# Patient Record
Sex: Female | Born: 1980 | Race: White | Hispanic: No | Marital: Single | Smoking: Former smoker
Health system: Southern US, Community
[De-identification: ages and names within clinical notes are randomized; demographics above are authoritative.]

## PROBLEM LIST (undated history)

## (undated) DIAGNOSIS — T8859XA Other complications of anesthesia, initial encounter: Secondary | ICD-10-CM

## (undated) DIAGNOSIS — B019 Varicella without complication: Secondary | ICD-10-CM

## (undated) DIAGNOSIS — F32A Depression, unspecified: Secondary | ICD-10-CM

## (undated) DIAGNOSIS — M542 Cervicalgia: Secondary | ICD-10-CM

## (undated) DIAGNOSIS — K219 Gastro-esophageal reflux disease without esophagitis: Secondary | ICD-10-CM

## (undated) DIAGNOSIS — Z8489 Family history of other specified conditions: Secondary | ICD-10-CM

## (undated) DIAGNOSIS — M1990 Unspecified osteoarthritis, unspecified site: Secondary | ICD-10-CM

## (undated) DIAGNOSIS — Z9889 Other specified postprocedural states: Secondary | ICD-10-CM

## (undated) DIAGNOSIS — R112 Nausea with vomiting, unspecified: Secondary | ICD-10-CM

## (undated) DIAGNOSIS — J45909 Unspecified asthma, uncomplicated: Secondary | ICD-10-CM

## (undated) DIAGNOSIS — F329 Major depressive disorder, single episode, unspecified: Secondary | ICD-10-CM

## (undated) DIAGNOSIS — G5601 Carpal tunnel syndrome, right upper limb: Secondary | ICD-10-CM

## (undated) HISTORY — PX: OTHER SURGICAL HISTORY: SHX169

## (undated) HISTORY — DX: Major depressive disorder, single episode, unspecified: F32.9

## (undated) HISTORY — DX: Depression, unspecified: F32.A

## (undated) HISTORY — PX: FOOT SURGERY: SHX648

## (undated) HISTORY — PX: TONSILLECTOMY: SUR1361

## (undated) HISTORY — PX: HAND SURGERY: SHX662

## (undated) HISTORY — PX: SHOULDER SURGERY: SHX246

## (undated) HISTORY — DX: Varicella without complication: B01.9

---

## 2010-01-09 HISTORY — PX: TONSILLECTOMY: SUR1361

## 2014-06-15 ENCOUNTER — Ambulatory Visit: Payer: No Typology Code available for payment source | Attending: Orthopedic Surgery | Admitting: Physical Therapy

## 2014-06-15 DIAGNOSIS — M7711 Lateral epicondylitis, right elbow: Secondary | ICD-10-CM | POA: Insufficient documentation

## 2014-06-24 ENCOUNTER — Ambulatory Visit: Payer: No Typology Code available for payment source | Admitting: Occupational Therapy

## 2014-06-24 DIAGNOSIS — M25421 Effusion, right elbow: Secondary | ICD-10-CM

## 2014-06-24 DIAGNOSIS — M25521 Pain in right elbow: Principal | ICD-10-CM

## 2014-06-24 DIAGNOSIS — M7711 Lateral epicondylitis, right elbow: Secondary | ICD-10-CM | POA: Diagnosis present

## 2014-06-24 DIAGNOSIS — M6281 Muscle weakness (generalized): Secondary | ICD-10-CM

## 2014-06-24 DIAGNOSIS — M25621 Stiffness of right elbow, not elsewhere classified: Secondary | ICD-10-CM

## 2014-06-24 DIAGNOSIS — M25631 Stiffness of right wrist, not elsewhere classified: Secondary | ICD-10-CM

## 2014-06-25 NOTE — Patient Instructions (Signed)
Pt ed on contrast for R elbow and no stretches or Nerve glides yet Wrist splint all the time 'and put on her lateral epicondylitis band  To get elbow pad to take pressure during day off cubital tunnel and then at night time inside to decrease flexion   Cross friction massage to lat epicondyle after contrast  Can do ice massage   Ed on modifying picking up objects- using palm, forearm - NOT palm down  Not tight grip or twisting - bigger and fatter handles

## 2014-06-25 NOTE — Therapy (Signed)
[facility] PH[facility]ORTS MEDICINE [address], [place], [address] Phone: [phone]   Fax:  [phone]  Occupational Therapy Treatment  Patient Details  Name: Stephanie Lyons MRN: [id number] Date of Birth: [birth year] Referring Provider:  Dedra Skeens, PA-C  Encounter Date: 06/24/2014      OT End of Session - 06/25/14 0837    Visit Number 1   Number of Visits 6   Date for OT Re-Evaluation 07/15/14   OT Start Time 1245      No past medical history on file.  No past surgical history on file.  There were no vitals filed for this visit.  Visit Diagnosis:  Pain and swelling of elbow, right - Plan: OT PLAN OF CARE CERT/RE-CERT  Stiffness of elbow joint, right - Plan: OT PLAN OF CARE CERT/RE-CERT  Stiffness of wrist joint, right - Plan: OT PLAN OF CARE CERT/RE-CERT  Muscle weakness - Plan: OT PLAN OF CARE CERT/RE-CERT      Subjective Assessment - 06/24/14 1823    Subjective  I had this for about 3 yrs - gradually got worse and I did not had insurance - the pain is between 6-10 constant - cannot use my R hand and arm in many actiivities   Patient Stated Goals Want the pain and numbness better that I can use my arm again    Currently in Pain? Yes   Pain Score 10-Worst pain ever   Pain Location Arm   Pain Orientation Left   Pain Descriptors / Indicators Aching;Burning;Pins and needles   Pain Type Chronic pain   Pain Onset Other (comment)   Pain Frequency Constant                              OT Education - 06/25/14 0837    Education provided Yes   Education Details pt instruction see   Person(s) Educated Patient   Methods Explanation;Demonstration;Verbal cues;Handout   Comprehension Verbalized understanding;Returned demonstration;Verbal cues required          OT Short Term Goals - 06/25/14 0924    OT SHORT TERM GOAL #1   Title Pain on PREE improve by 10-15 points   Baseline Pain on PREE  47/50    Time 3   Period Weeks   Status New   OT SHORT TERM GOAL #2   Title Pt to be Ind in wearing wrist splint, lateral epicondyle strap and elbow guard to decrease symptoms    Baseline Pain 10/10 at elbow at the worse and 100% of time - and numbness on ulnar digits    Time 2   Period Weeks   Status New   OT SHORT TERM GOAL #3   Title Wrist and elbow AROM improve in  flexion and extention by 10-15 degrees with pain 3 down on scale   Baseline 3   Period Weeks   Status New           OT Long Term Goals - 06/25/14 2841    OT LONG TERM GOAL #1   Title Pt to be Ind in HEP to decrease pain and function on PREE by 15 points    Baseline PREE 47/50; function 46/50   Time 3   Period Weeks   Status New               Plan - 06/24/14 1444    Clinical Impression Statement Pt had symptoms for  about 2-3 yrs for lateral epicondylitis but numbness started about 6 months ago - present with increase pain - tenderness over extensor mass of forearm and lateral epicondyle - positive decrease grip and middle finger  extention test  positivie -  also signs for Cubital tunnel - posiitvie Tinel  and numnbess ulnar 3 digits -  pt ed on wearing strap at lateral epicondyle and fitted with wrist  splint to rest extentsors n- also ed on modificationa and getting  elbow guard for  sleeping and during day wear  to decrease flexion and pressure on cubital tunnel -  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 - will try 4-5 sessions of ionto , with splint wearing and HEP - pt follow up with MD 6/30    Pt will benefit from skilled therapeutic intervention in order to improve on the following deficits (Retired) Decreased range of motion;Impaired flexibility;Pain;Impaired UE functional use;Decreased knowledge of use of DME;Decreased strength   Rehab Potential Fair   Clinical Impairments Affecting Rehab Potential Had lateral epicondylits for about 2-3 yrs and had in past 3 shots    OT Frequency 2x / week   OT Duration  Other (comment)   OT Treatment/Interventions Self-care/ADL training;Therapeutic exercise;Patient/family education;Splinting;Manual Therapy;Ultrasound;Iontophoresis;DME and/or AE instruction;Moist Heat;Contrast Bath;Passive range of motion   Plan Assess use with wrist splint , elbow guard and lateral epicondylitis strap - as well as HEP    OT Home Exercise Plan see pt instruction   Consulted and Agree with Plan of Care Patient        Problem List There are no active problems to display for this patient.   Oletta Cohn OTR/L, CLT 06/25/2014, 9:34 AM  [place] [facility] PHYSICAL AND SPORTS MEDICINE [address] [place], [address] Phone: [phone]   Fax:  [phone]

## 2014-06-29 ENCOUNTER — Encounter: Payer: No Typology Code available for payment source | Admitting: Physical Therapy

## 2014-06-30 ENCOUNTER — Ambulatory Visit: Payer: No Typology Code available for payment source | Admitting: Occupational Therapy

## 2014-07-01 ENCOUNTER — Encounter: Payer: No Typology Code available for payment source | Admitting: Physical Therapy

## 2014-07-02 ENCOUNTER — Ambulatory Visit: Payer: No Typology Code available for payment source | Admitting: Occupational Therapy

## 2014-07-02 DIAGNOSIS — M6281 Muscle weakness (generalized): Secondary | ICD-10-CM

## 2014-07-02 DIAGNOSIS — M7711 Lateral epicondylitis, right elbow: Secondary | ICD-10-CM | POA: Diagnosis not present

## 2014-07-02 DIAGNOSIS — M25621 Stiffness of right elbow, not elsewhere classified: Secondary | ICD-10-CM

## 2014-07-02 DIAGNOSIS — M25421 Effusion, right elbow: Secondary | ICD-10-CM

## 2014-07-02 DIAGNOSIS — M25521 Pain in right elbow: Principal | ICD-10-CM

## 2014-07-02 DIAGNOSIS — M25631 Stiffness of right wrist, not elsewhere classified: Secondary | ICD-10-CM

## 2014-07-02 NOTE — Patient Instructions (Signed)
Same HEP as last time - but need to take ionto pads off in 4hrs - 5hrs

## 2014-07-02 NOTE — Therapy (Signed)
[facility] [facility] PHYS[facility] SPORTS MEDICINE [address] [place], [address] Phone: [phone]   Fax:  [phone]  Occupational Therapy Treatment  Patient Details  Name: Stephanie Lyons MRN: [id number] Date of Birth: 08/10/1980 Referring Provider:  Dedra Skeens, PA-C  Encounter Date: 07/02/2014      OT End of Session - 07/02/14 1851    Visit Number 2   Number of Visits 6   Date for OT Re-Evaluation 07/15/14   OT Start Time 1340   OT Stop Time 1412   OT Time Calculation (min) 32 min   Activity Tolerance Patient tolerated treatment well   Behavior During Therapy [facility] for tasks assessed/performed      No past medical history on file.  No past surgical history on file.  There were no vitals filed for this visit.  Visit Diagnosis:  Pain and swelling of elbow, right  Stiffness of elbow joint, right  Stiffness of wrist joint, right  Muscle weakness      Subjective Assessment - 07/02/14 1846    Subjective  It really hurts - took some pain meds just before coming - I could not get elbow splint did get my forearm splint and put it around elbow and wore the wrist splint - had orientation at [employer] for job    Patient Stated Goals Want the pain and numbness better that I can use my arm again    Pain Score 8    Pain Location Elbow   Pain Orientation Right   Pain Descriptors / Indicators Aching;Burning;Pins and needles   Pain Type Chronic pain   Pain Onset Other (comment)   Pain Frequency Constant                      OT Treatments/Exercises (OP) - 07/02/14 0001    Iontophoresis   Type of Iontophoresis Dexamethasone   Location lateral epicondyle and cubital tunnel on R    Dose 1.2 ml   Time 4 hrs slow release that pt leave with - pt ed on wearing    RUE Contrast Bath   Time 15 minutes   Comments at R elbow at [facility] to decrease pain and edema - throbbing did improve    Splinting   Splinting Pt to get volley ball  knee pads or elbow pad to wear at night time to decreaese elbow flexoin and during day decrease compression on cubital tunnel                 OT Education - 07/02/14 1851    Education provided Yes   Education Details HEP   Person(s) Educated Patient   Methods Explanation;Demonstration;Verbal cues   Comprehension Verbalized understanding;Verbal cues required;Returned demonstration          OT Short Term Goals - 06/25/14 0924    OT SHORT TERM GOAL #1   Title Pain on PREE improve by 10-15 points   Baseline Pain on PREE 47/50    Time 3   Period Weeks   Status New   OT SHORT TERM GOAL #2   Title Pt to be Ind in wearing wrist splint, lateral epicondyle strap and elbow guard to decrease symptoms    Baseline Pain 10/10 at elbow at the worse and 100% of time - and numbness on ulnar digits    Time 2   Period Weeks   Status New   OT SHORT TERM GOAL #3   Title Wrist and elbow AROM improve  in  flexion and extention by 10-15 degrees with pain 3 down on scale   Baseline 3   Period Weeks   Status New           OT Long Term Goals - 06/25/14 1610    OT LONG TERM GOAL #1   Title Pt to be Ind in HEP to decrease pain and function on PREE by 15 points    Baseline PREE 47/50; function 46/50   Time 3   Period Weeks   Status New               Plan - 07/02/14 1853    Clinical Impression Statement Pain still 8/10 coming in  - to tender for any manual or ROM - or nerve glides - pt to cont with HEP from last time and used wireless slow release ionto patches - pt to remove in 4hrs    Pt will benefit from skilled therapeutic intervention in order to improve on the following deficits (Retired) Decreased range of motion;Impaired flexibility;Pain;Impaired UE functional use;Decreased knowledge of use of DME;Decreased strength   Rehab Potential Fair   Clinical Impairments Affecting Rehab Potential Had lateral epicondylits for about 2-3 yrs and had in past 3 shots    OT Frequency 2x /  week   OT Duration 2 weeks   OT Treatment/Interventions Self-care/ADL training;Therapeutic exercise;Patient/family education;Splinting;Manual Therapy;Ultrasound;Iontophoresis;DME and/or AE instruction;Moist Heat;Contrast Bath;Passive range of motion   OT Home Exercise Plan see pt instruction   Consulted and Agree with Plan of Care Patient        Problem List There are no active problems to display for this patient.   Oletta Cohn OTR/L, CLT 07/02/2014, 6:56 PM  [place] [facility] PHYSICAL AND SPORTS MEDICINE [address] [place], [address] Phone: [phone]   Fax:  [phone]

## 2014-07-06 ENCOUNTER — Ambulatory Visit: Payer: No Typology Code available for payment source | Admitting: Occupational Therapy

## 2014-07-06 DIAGNOSIS — M6281 Muscle weakness (generalized): Secondary | ICD-10-CM

## 2014-07-06 DIAGNOSIS — M25421 Effusion, right elbow: Secondary | ICD-10-CM

## 2014-07-06 DIAGNOSIS — M7711 Lateral epicondylitis, right elbow: Secondary | ICD-10-CM | POA: Diagnosis not present

## 2014-07-06 DIAGNOSIS — M25631 Stiffness of right wrist, not elsewhere classified: Secondary | ICD-10-CM

## 2014-07-06 DIAGNOSIS — M25621 Stiffness of right elbow, not elsewhere classified: Secondary | ICD-10-CM

## 2014-07-06 DIAGNOSIS — M25521 Pain in right elbow: Principal | ICD-10-CM

## 2014-07-06 NOTE — Therapy (Signed)
[place] [facility] PHYSICAL [facility] SPORTS MEDICINE [address] [place], [address] Phone: [phone]   Fax:  [phone]  Occupational Therapy Treatment  Patient Details  Name: Stephanie Lyons MRN: [id number] Date of Birth: 1980-05-15 Referring Provider:  Dedra Skeens, PA-C  Encounter Date: 07/06/2014      OT End of Session - 07/06/14 1729    Visit Number 3   Number of Visits 6   Date for OT Re-Evaluation 07/15/14   OT Start Time 1359   OT Stop Time 1415   OT Time Calculation (min) 16 min   Activity Tolerance Patient limited by pain   Behavior During Therapy [facility] for tasks assessed/performed      No past medical history on file.  No past surgical history on file.  There were no vitals filed for this visit.  Visit Diagnosis:  Pain and swelling of elbow, right  Stiffness of elbow joint, right  Stiffness of wrist joint, right  Muscle weakness      Subjective Assessment - 07/06/14 1725    Subjective  I was at that pride parade and used my arm  alot - more than I should have and it just hurt - taking ibruprofen - pain still 91/0 and it is just tender even with any pressue or movement of elbow    Patient Stated Goals Want the pain and numbness better that I can use my arm again    Pain Score 9    Pain Location Elbow   Pain Orientation Right   Pain Descriptors / Indicators Aching;Tingling;Burning   Pain Type Chronic pain   Pain Onset Other (comment)   Pain Frequency Constant                      OT Treatments/Exercises (OP) - 07/06/14 0001    Hand Exercises   Other Hand Exercises attempted med and ulnar N glide - unable - pt to try after contrast to only do  first step of each    Iontophoresis   Type of Iontophoresis Dexamethasone   Location lateral epicondyle and cubital tunnel on R    Dose 1.54ml   Time 4 hrs slow release that pt leave with - pt ed on wearing    Manual Therapy   Manual therapy comments tender  touch and not tolerating any manual                 OT Education - 07/06/14 1729    Education provided Yes   Education Details HEP   Person(s) Educated Patient   Methods Explanation;Demonstration;Tactile cues   Comprehension Verbalized understanding;Returned demonstration;Verbal cues required          OT Short Term Goals - 07/06/14 1731    OT SHORT TERM GOAL #1   Title Pain on PREE improve by 10-15 points   Baseline Pain on PREE 47/50    Time 2   Status On-going   OT SHORT TERM GOAL #2   Title Pt to be Ind in wearing wrist splint, lateral epicondyle strap and elbow guard to decrease symptoms    Baseline Pain 10/10 at elbow at the worse and 100% of time - and numbness on ulnar digits    Time 1   Period Weeks   Status On-going   OT SHORT TERM GOAL #3   Title Wrist and elbow AROM improve in  flexion and extention by 10-15 degrees with pain 3 down on scale   Time 2  Status On-going           OT Long Term Goals - 07/06/14 1732    OT LONG TERM GOAL #1   Title Pt to be Ind in HEP to decrease pain and function on PREE by 15 points    Baseline PREE 47/50; function 46/50   Time 2   Period Weeks   Status On-going               Plan - 07/06/14 1730    Clinical Impression Statement Pain still 9/10 - not tolerating any manual - did report ionto was burning after 2 hrs and removed it last time- pt to do same if any issues - and try first step of Med and Ulnar N glide -    Pt will benefit from skilled therapeutic intervention in order to improve on the following deficits (Retired) Decreased range of motion;Impaired flexibility;Pain;Impaired UE functional use;Decreased knowledge of use of DME;Decreased strength   Rehab Potential Fair   Clinical Impairments Affecting Rehab Potential Had lateral epicondylits for about 2-3 yrs and had in past 3 shots    OT Frequency 2x / week   OT Duration 2 weeks   Plan Assess pain and tolerance of slow release Ionto   OT Home  Exercise Plan see pt instruction   Consulted and Agree with Plan of Care Patient        Problem List There are no active problems to display for this patient.   Oletta Cohn OTR/L, CLT 07/06/2014, 5:33 PM  [facility] PHYSICAL AND SPORTS MEDICINE [address], [place], [address] Phone: [phone]   Fax:  [phone]

## 2014-07-06 NOTE — Patient Instructions (Signed)
Try first step of Med and Ulnar N glide

## 2014-07-08 ENCOUNTER — Ambulatory Visit: Payer: No Typology Code available for payment source | Admitting: Occupational Therapy

## 2014-07-08 DIAGNOSIS — M25421 Effusion, right elbow: Secondary | ICD-10-CM

## 2014-07-08 DIAGNOSIS — M25621 Stiffness of right elbow, not elsewhere classified: Secondary | ICD-10-CM

## 2014-07-08 DIAGNOSIS — M7711 Lateral epicondylitis, right elbow: Secondary | ICD-10-CM | POA: Diagnosis not present

## 2014-07-08 DIAGNOSIS — M25631 Stiffness of right wrist, not elsewhere classified: Secondary | ICD-10-CM

## 2014-07-08 DIAGNOSIS — M25521 Pain in right elbow: Secondary | ICD-10-CM

## 2014-07-08 DIAGNOSIS — M6281 Muscle weakness (generalized): Secondary | ICD-10-CM

## 2014-07-08 NOTE — Therapy (Signed)
[place] [facility] SPORTS MEDICINE [address], [place], [address] Phone: [phone]   Fax:  [phone]  Occupational Therapy Treatment  Patient Details  Name: Stephanie Lyons MRN: [id number] Date of Birth: [birth year] Referring Provider:  Reche Dixon, PA-C  Encounter Date: 07/08/2014      OT End of Session - 07/08/14 1613    Visit Number 4   Number of Visits 4   Date for OT Re-Evaluation 07/08/14   OT Start Time 3785   OT Stop Time 1425   OT Time Calculation (min) 38 min   Activity Tolerance Patient limited by pain   Behavior During Therapy [facility] for tasks assessed/performed      No past medical history on file.  No past surgical history on file.  There were no vitals filed for this visit.  Visit Diagnosis:  Stiffness of elbow joint, right  Stiffness of wrist joint, right  Pain and swelling of elbow, right  Muscle weakness      Subjective Assessment - 07/08/14 1350    Subjective  Pain 9/10 - just got off work and really painfull - it is pulling and grabbing at my elbow , the inside and up the back of my elbow as well as cannot grip -then forearm shooting - numbness in my pinkie , ring and middle finger    Patient Stated Goals Want the pain and numbness better that I can use my arm again    Currently in Pain? Yes   Pain Score 9    Pain Location Elbow   Pain Orientation Right   Pain Descriptors / Indicators Shooting;Sharp;Aching;Throbbing   Pain Type Chronic pain   Pain Onset More than a month ago   Pain Frequency Constant   Aggravating Factors  everything            [facility] OT Assessment - 07/08/14 0001    AROM   Right Elbow Flexion 115   Right Elbow Extension --  -20   Strength   Right Hand Grip (lbs) 9                  OT Treatments/Exercises (OP) - 07/08/14 0001    Hand Exercises   Other Hand Exercises assess grip strength and elbow flexion /extention compare to evaluation date    Iontophoresis   Type of Iontophoresis Dexamethasone   Location lateral epicondyle and cubital tunnel on R    Dose 1.22m   Time 4 hrs slow release that pt leave with - pt ed on wearing    RUE Contrast Bath   Time 15 minutes   [facility]    [facility]ual [facility]   Manual therapy comments tender touch and not tolerating any manual                 OT Education - 07/08/14 1613    Education provided Yes   Education Details HEP   Person(s) Educated Patient   Methods Explanation;Demonstration;Tactile cues   Comprehension Verbalized understanding;Returned demonstration;Verbal cues required          OT Short Term Goals - 07/08/14 1616    OT SHORT TERM GOAL #1   Title Pain on PREE improve by 10-15 points   Baseline Pain on PREE 47/50    Status Not Met   OT SHORT TERM GOAL #2   Title Pt to be Ind in wearing wrist splint, lateral epicondyle strap and elbow guard to decrease  symptoms    Baseline Pain 10/10 at elbow at the worse and 100% of time - and numbness on ulnar digits    Status Partially Met   OT SHORT TERM GOAL #3   Title Wrist and elbow AROM improve in  flexion and extention by 10-15 degrees with pain 3 down on scale   Status Partially Met           OT Long Term Goals - 07/08/14 1617    OT LONG TERM GOAL #1   Title Pt to be Ind in HEP to decrease pain and function on PREE by 15 points    Status Not Met               Plan - 07/08/14 1614    Clinical Impression Statement Pt was seen for 4 sessions and ionto with dexamethazone done - pt pain still 9/10 and present from eval with lateral epicondylitis , 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 - pt cannot tolerate any manual or nerve glides -  pt to see MD for further assesments and intervention    Rehab Potential Fair   Clinical Impairments Affecting Rehab Potential Had lateral epicondylits for about 2-3 yrs and had in past 3 shots    OT Treatment/Interventions Self-care/ADL  training;Therapeutic exercise;Patient/family education;Splinting;Manual Therapy;Ultrasound;Iontophoresis;DME and/or AE instruction;Moist Heat;Contrast Bath;Passive range of motion   Plan Pt to see MD tomorrow   OT Home Exercise Plan see pt instruction   Consulted and Agree with Plan of Care Patient        Problem List There are no active problems to display for this patient.   Cayden Granholm OTR/L; CLT 07/08/2014, 4:18 PM  [place] [facility] AND SPORTS MEDICINE [address], [place], [address] Phone: [phone]   Fax:  [phone]

## 2014-07-08 NOTE — Patient Instructions (Signed)
IF she can do any flexion, extention , sup/pro   And then initiate first step of nerve glides med and ulnar

## 2014-07-09 ENCOUNTER — Encounter: Payer: No Typology Code available for payment source | Admitting: Physical Therapy

## 2014-07-15 ENCOUNTER — Ambulatory Visit: Payer: No Typology Code available for payment source | Attending: Orthopedic Surgery | Admitting: Occupational Therapy

## 2014-07-17 ENCOUNTER — Ambulatory Visit: Payer: No Typology Code available for payment source | Admitting: Occupational Therapy

## 2014-08-07 DIAGNOSIS — M5412 Radiculopathy, cervical region: Secondary | ICD-10-CM | POA: Insufficient documentation

## 2014-09-18 ENCOUNTER — Other Ambulatory Visit: Payer: Self-pay | Admitting: Orthopedic Surgery

## 2014-09-18 DIAGNOSIS — M5412 Radiculopathy, cervical region: Secondary | ICD-10-CM

## 2014-09-24 ENCOUNTER — Ambulatory Visit
Admission: RE | Admit: 2014-09-24 | Discharge: 2014-09-24 | Disposition: A | Discharge status: Home / Self Care | Payer: No Typology Code available for payment source | Source: Ambulatory Visit | Attending: Orthopedic Surgery | Admitting: Orthopedic Surgery

## 2014-09-24 DIAGNOSIS — M5412 Radiculopathy, cervical region: Secondary | ICD-10-CM | POA: Diagnosis not present

## 2014-09-24 DIAGNOSIS — M4802 Spinal stenosis, cervical region: Secondary | ICD-10-CM | POA: Diagnosis not present

## 2014-12-13 ENCOUNTER — Emergency Department
Admission: EM | Admit: 2014-12-13 | Discharge: 2014-12-13 | Disposition: A | Discharge status: Home / Self Care | Payer: No Typology Code available for payment source | Attending: Emergency Medicine | Admitting: Emergency Medicine

## 2014-12-13 ENCOUNTER — Encounter: Payer: Self-pay | Admitting: *Deleted

## 2014-12-13 DIAGNOSIS — F17200 Nicotine dependence, unspecified, uncomplicated: Secondary | ICD-10-CM | POA: Insufficient documentation

## 2014-12-13 DIAGNOSIS — Y9389 Activity, other specified: Secondary | ICD-10-CM | POA: Insufficient documentation

## 2014-12-13 DIAGNOSIS — W268XXA Contact with other sharp object(s), not elsewhere classified, initial encounter: Secondary | ICD-10-CM | POA: Insufficient documentation

## 2014-12-13 DIAGNOSIS — Y9289 Other specified places as the place of occurrence of the external cause: Secondary | ICD-10-CM | POA: Insufficient documentation

## 2014-12-13 DIAGNOSIS — Y998 Other external cause status: Secondary | ICD-10-CM | POA: Insufficient documentation

## 2014-12-13 DIAGNOSIS — S0181XA Laceration without foreign body of other part of head, initial encounter: Secondary | ICD-10-CM | POA: Insufficient documentation

## 2014-12-13 HISTORY — DX: Cervicalgia: M54.2

## 2014-12-13 MED ORDER — TRAMADOL HCL 50 MG PO TABS: 50.0000 mg | ORAL_TABLET | Freq: Four times a day (QID) | ORAL | Status: DC | PRN

## 2014-12-13 MED ORDER — BACITRACIN-NEOMYCIN-POLYMYXIN OINTMENT TUBE
TOPICAL_OINTMENT | Freq: Once | CUTANEOUS | Status: AC
  Administered 2014-12-13: 1 via TOPICAL

## 2014-12-13 MED ORDER — TRAMADOL HCL 50 MG PO TABS
50.0000 mg | ORAL_TABLET | Freq: Once | ORAL | Status: AC
Start: 2014-12-13 — End: 2014-12-13
  Administered 2014-12-13: 50 mg via ORAL
  Filled 2014-12-13: qty 1

## 2014-12-13 MED ORDER — BACITRACIN-NEOMYCIN-POLYMYXIN 400-5-5000 EX OINT
TOPICAL_OINTMENT | CUTANEOUS | Status: AC
  Filled 2014-12-13: qty 1

## 2014-12-13 MED ORDER — LIDOCAINE HCL (PF) 1 % IJ SOLN
INTRAMUSCULAR | Status: AC
  Filled 2014-12-13: qty 5

## 2014-12-13 NOTE — ED Notes (Signed)
Patient states she was trying to unload the dishwasher and cut her face on the metal piece sticking up in the dishwasher. Laceration is on the left side of face, bleeding freely.

## 2014-12-13 NOTE — Discharge Instructions (Signed)
Laceration Care, Adult  A laceration is a cut that goes through all layers of the skin. The cut also goes into the tissue that is right under the skin. Some cuts heal on their own. Others need to be closed with stitches (sutures), staples, skin adhesive strips, or wound glue. Taking care of your cut lowers your risk of infection and helps your cut to heal better.  HOW TO TAKE CARE OF YOUR CUT  For stitches or staples:  · Keep the wound clean and dry.  · If you were given a bandage (dressing), you should change it at least one time per day or as told by your doctor. You should also change it if it gets wet or dirty.  · Keep the wound completely dry for the first 24 hours or as told by your doctor. After that time, you may take a shower or a bath. However, make sure that the wound is not soaked in water until after the stitches or staples have been removed.  · Clean the wound one time each day or as told by your doctor:    Wash the wound with soap and water.    Rinse the wound with water until all of the soap comes off.    Pat the wound dry with a clean towel. Do not rub the wound.  · After you clean the wound, put a thin layer of antibiotic ointment on it as told by your doctor. This ointment:    Helps to prevent infection.    Keeps the bandage from sticking to the wound.  · Have your stitches or staples removed as told by your doctor.  If your doctor used skin adhesive strips:   · Keep the wound clean and dry.  · If you were given a bandage, you should change it at least one time per day or as told by your doctor. You should also change it if it gets dirty or wet.  · Do not get the skin adhesive strips wet. You can take a shower or a bath, but be careful to keep the wound dry.  · If the wound gets wet, pat it dry with a clean towel. Do not rub the wound.  · Skin adhesive strips fall off on their own. You can trim the strips as the wound heals. Do not remove any strips that are still stuck to the wound. They will  fall off after a while.  If your doctor used wound glue:  · Try to keep your wound dry, but you may briefly wet it in the shower or bath. Do not soak the wound in water, such as by swimming.  · After you take a shower or a bath, gently pat the wound dry with a clean towel. Do not rub the wound.  · Do not do any activities that will make you really sweaty until the skin glue has fallen off on its own.  · Do not apply liquid, cream, or ointment medicine to your wound while the skin glue is still on.  · If you were given a bandage, you should change it at least one time per day or as told by your doctor. You should also change it if it gets dirty or wet.  · If a bandage is placed over the wound, do not let the tape for the bandage touch the skin glue.  · Do not pick at the glue. The skin glue usually stays on for 5-10 days. Then, it   falls off of the skin.  General Instructions   · To help prevent scarring, make sure to cover your wound with sunscreen whenever you are outside after stitches are removed, after adhesive strips are removed, or when wound glue stays in place and the wound is healed. Make sure to wear a sunscreen of at least 30 SPF.  · Take over-the-counter and prescription medicines only as told by your doctor.  · If you were given antibiotic medicine or ointment, take or apply it as told by your doctor. Do not stop using the antibiotic even if your wound is getting better.  · Do not scratch or pick at the wound.  · Keep all follow-up visits as told by your doctor. This is important.  · Check your wound every day for signs of infection. Watch for:    Redness, swelling, or pain.    Fluid, blood, or pus.  · Raise (elevate) the injured area above the level of your heart while you are sitting or lying down, if possible.  GET HELP IF:  · You got a tetanus shot and you have any of these problems at the injection site:    Swelling.    Very bad pain.    Redness.    Bleeding.  · You have a fever.  · A wound that was  closed breaks open.  · You notice a bad smell coming from your wound or your bandage.  · You notice something coming out of the wound, such as wood or glass.  · Medicine does not help your pain.  · You have more redness, swelling, or pain at the site of your wound.  · You have fluid, blood, or pus coming from your wound.  · You notice a change in the color of your skin near your wound.  · You need to change the bandage often because fluid, blood, or pus is coming from the wound.  · You start to have a new rash.  · You start to have numbness around the wound.  GET HELP RIGHT AWAY IF:  · You have very bad swelling around the wound.  · Your pain suddenly gets worse and is very bad.  · You notice painful lumps near the wound or on skin that is anywhere on your body.  · You have a red streak going away from your wound.  · The wound is on your hand or foot and you cannot move a finger or toe like you usually can.  · The wound is on your hand or foot and you notice that your fingers or toes look pale or bluish.     This information is not intended to replace advice given to you by your health care provider. Make sure you discuss any questions you have with your health care provider.     Document Released: 06/14/2007 Document Revised: 05/12/2014 Document Reviewed: 12/22/2013  Elsevier Interactive Patient Education ©2016 [employer]

## 2014-12-13 NOTE — ED Provider Notes (Addendum)
[facility] Emergency Department Provider Note  ____________________________________________  Time seen: Approximately 6:07 PM  I have reviewed the triage vital signs and the nursing notes.   HISTORY  Chief Complaint Facial Laceration    HPI Stephanie Lyons is a 34 y.o. female patient has a facial lacerations to the left lateral maxillary area. Patient sustained a metal cutfrom a dishwasher. Hemorrhaging is controlled. Patient rates the pain as a 6/10. No other palliative measures taken for this complaint.. Patient denies any loss of sensation or movement  Past Medical History  Diagnosis Date  . Neck pain     There are no active problems to display for this patient.   Past Surgical History  Procedure Laterality Date  . Tonsillectomy    . Left foot surgery      Current Outpatient Rx  Name  Route  Sig  Dispense  Refill  . traMADol (ULTRAM) 50 MG tablet   Oral   Take 1 tablet (50 mg total) by mouth every 6 (six) hours as needed for moderate pain.   12 tablet   0     Allergies Morphine and related and Percocet  No family history on file.  Social History Social History  Substance Use Topics  . Smoking status: Current Every Day Smoker  . Smokeless tobacco: Not on file  . Alcohol Use: Yes     Comment: social    Review of Systems Constitutional: No fever/chills Eyes: No visual changes. ENT: No sore throat. Cardiovascular: Denies chest pain. Respiratory: Denies shortness of breath. Gastrointestinal: No abdominal pain.  No nausea, no vomiting.  No diarrhea.  No constipation. Genitourinary: Negative for dysuria. Musculoskeletal: Negative for back pain. Skin: Negative for rash. Laceration to left side of face. Neurological: Negative for headaches, focal weakness or numbness. Hematological/Lymphatic: Allergic/Immunilogical: Morphine and Percocets. 10-point ROS otherwise  negative.  ____________________________________________   PHYSICAL EXAM:  VITAL SIGNS: ED Triage Vitals  Enc Vitals Group     BP 12/13/14 1747 132/84 mmHg     Pulse Rate 12/13/14 1747 91     Resp 12/13/14 1747 18     Temp 12/13/14 1747 98.2 F (36.8 C)     Temp Source 12/13/14 1747 Oral     SpO2 12/13/14 1747 98 %     Weight 12/13/14 1747 160 lb (72.576 kg)     Height 12/13/14 1747 5\' 2"  (1.575 m)     Head Cir --      Peak Flow --      Pain Score 12/13/14 1756 6     Pain Loc --      Pain Edu? --      Excl. in GC? --     Constitutional: Alert and oriented. Well appearing and in no acute distress. Eyes: Conjunctivae are normal. PERRL. EOMI. Head: Atraumatic. Nose: No congestion/rhinnorhea. Mouth/Throat: Mucous membranes are moist.  Oropharynx non-erythematous. Neck: No stridor. No cervical spine tenderness to palpation. Hematological/Lymphatic/Immunilogical: No cervical lymphadenopathy. Cardiovascular: Normal rate, regular rhythm. Grossly normal heart sounds.  Good peripheral circulation. Respiratory: Normal respiratory effort.  No retractions. Lungs CTAB. Gastrointestinal: Soft and nontender. No distention. No abdominal bruits. No CVA tenderness. Musculoskeletal: No lower extremity tenderness nor edema.  No joint effusions. Neurologic:  Normal speech and language. No gross focal neurologic deficits are appreciated. No gait instability. Skin:  Skin is warm, dry and intact. No rash noted. 1 cm laceration left lateral maxillary facial area Psychiatric: Mood and affect are normal. Speech and behavior are normal.  ____________________________________________  LABS (all labs ordered are listed, but only abnormal results are displayed)  Labs Reviewed - No data to display ____________________________________________  EKG   ____________________________________________  RADIOLOGY   ____________________________________________   PROCEDURES  Procedure(s) performed:  See procedure note  Critical Care performed: No LACERATION REPAIR Performed by: Joni Reining Authorized by: Joni Reining Consent: Verbal consent obtained. Risks and benefits: risks, benefits and alternatives were discussed Consent given by: patient Patient identity confirmed: provided demographic data Prepped and Draped in normal sterile fashion Wound explored  Laceration Location: Left maxillary facial area Laceration  Length: 1 cm No Foreign Bodies seen or palpated  Anesthesia: local infiltration  Local anesthetic: lidocaine 1% without epinephrine  Anesthetic total: 3 ML's   Irrigation method: syringe Amount of cleaning: standard  Skin closure: 5-0 nylon   Number of sutures:4  Technique: Simple Patient tolerance: Patient tolerated the procedure well with no immediate complications.  ____________________________________________   INITIAL IMPRESSION / ASSESSMENT AND PLAN / ED COURSE  Pertinent labs & imaging results that were available during my care of the patient were reviewed by me and considered in my medical decision making (see chart for details).  Facial laceration. Area was sutured. Patient given discharge home care instructions. He advised to follow-up with family doctor or urgent care clinic to have sutures removed in 5 days. Patient given a prescription for tramadol to take as needed for pain. ____________________________________________   FINAL CLINICAL IMPRESSION(S) / ED DIAGNOSES  Final diagnoses:  Facial laceration, initial encounter       Joni Reining, PA-C 12/13/14 1833  Jeanmarie Plant, MD 12/13/14 2026  Joni Reining, PA-C 12/28/14 1308  Jeanmarie Plant, MD 01/01/15 [phone]

## 2014-12-13 NOTE — ED Notes (Signed)
Pt with left cheek lac. Not bleeding at this time.

## 2015-03-24 DIAGNOSIS — G5601 Carpal tunnel syndrome, right upper limb: Secondary | ICD-10-CM | POA: Insufficient documentation

## 2015-03-31 ENCOUNTER — Encounter: Payer: Self-pay | Admitting: *Deleted

## 2015-04-02 ENCOUNTER — Encounter
Admission: RE | Disposition: A | Discharge status: Home / Self Care | Payer: Self-pay | Source: Ambulatory Visit | Attending: Unknown Physician Specialty

## 2015-04-02 ENCOUNTER — Ambulatory Visit: Payer: BLUE CROSS/BLUE SHIELD | Admitting: Anesthesiology

## 2015-04-02 ENCOUNTER — Ambulatory Visit
Admission: RE | Admit: 2015-04-02 | Discharge: 2015-04-02 | Disposition: A | Discharge status: Home / Self Care | Payer: BLUE CROSS/BLUE SHIELD | Source: Ambulatory Visit | Attending: Unknown Physician Specialty | Admitting: Unknown Physician Specialty

## 2015-04-02 DIAGNOSIS — G5601 Carpal tunnel syndrome, right upper limb: Secondary | ICD-10-CM | POA: Diagnosis not present

## 2015-04-02 HISTORY — PX: CARPAL TUNNEL RELEASE: SHX101

## 2015-04-02 HISTORY — DX: Carpal tunnel syndrome, right upper limb: G56.01

## 2015-04-02 HISTORY — DX: Unspecified asthma, uncomplicated: J45.909

## 2015-04-02 SURGERY — CARPAL TUNNEL RELEASE
Anesthesia: General | Site: Hand | Laterality: Right | Wound class: Clean

## 2015-04-02 MED ORDER — ROPIVACAINE HCL 5 MG/ML IJ SOLN
INTRAMUSCULAR | Status: DC | PRN
  Administered 2015-04-02: 7 mL

## 2015-04-02 MED ORDER — LIDOCAINE HCL (CARDIAC) 20 MG/ML IV SOLN
INTRAVENOUS | Status: DC | PRN
  Administered 2015-04-02: 40 mg via INTRATRACHEAL

## 2015-04-02 MED ORDER — FENTANYL CITRATE (PF) 100 MCG/2ML IJ SOLN
INTRAMUSCULAR | Status: DC | PRN
  Administered 2015-04-02: 50 ug via INTRAVENOUS
  Administered 2015-04-02 (×2): 25 ug via INTRAVENOUS

## 2015-04-02 MED ORDER — DEXAMETHASONE SODIUM PHOSPHATE 4 MG/ML IJ SOLN
INTRAMUSCULAR | Status: DC | PRN
  Administered 2015-04-02: 4 mg via INTRAVENOUS

## 2015-04-02 MED ORDER — MIDAZOLAM HCL 5 MG/5ML IJ SOLN
INTRAMUSCULAR | Status: DC | PRN
  Administered 2015-04-02: 2 mg via INTRAVENOUS

## 2015-04-02 MED ORDER — LACTATED RINGERS IV SOLN
INTRAVENOUS | Status: DC
  Administered 2015-04-02: 08:00:00 via INTRAVENOUS

## 2015-04-02 MED ORDER — LACTATED RINGERS IV SOLN: 500.0000 mL | INTRAVENOUS | Status: DC

## 2015-04-02 MED ORDER — GLYCOPYRROLATE 0.2 MG/ML IJ SOLN
INTRAMUSCULAR | Status: DC | PRN
  Administered 2015-04-02: 0.1 mg via INTRAVENOUS

## 2015-04-02 MED ORDER — ONDANSETRON HCL 4 MG/2ML IJ SOLN
INTRAMUSCULAR | Status: DC | PRN
  Administered 2015-04-02: 4 mg via INTRAVENOUS

## 2015-04-02 MED ORDER — PROPOFOL 10 MG/ML IV BOLUS
INTRAVENOUS | Status: DC | PRN
  Administered 2015-04-02: 150 mg via INTRAVENOUS

## 2015-04-02 SURGICAL SUPPLY — 27 items
BANDAGE ELASTIC 2 LF NS (GAUZE/BANDAGES/DRESSINGS) ×2 IMPLANT
BNDG ESMARK 4X12 TAN STRL LF (GAUZE/BANDAGES/DRESSINGS) ×2 IMPLANT
COVER LIGHT HANDLE FLEXIBLE (MISCELLANEOUS) ×2 IMPLANT
CUFF TOURN SGL QUICK 18 (TOURNIQUET CUFF) ×2 IMPLANT
DURAPREP 26ML APPLICATOR (WOUND CARE) ×2 IMPLANT
GAUZE SPONGE 4X4 12PLY STRL (GAUZE/BANDAGES/DRESSINGS) ×2 IMPLANT
GLOVE BIO SURGEON STRL SZ7.5 (GLOVE) ×4 IMPLANT
GLOVE BIO SURGEON STRL SZ8 (GLOVE) ×2 IMPLANT
GLOVE INDICATOR 8.0 STRL GRN (GLOVE) ×4 IMPLANT
GOWN STRL REUS W/ TWL LRG LVL3 (GOWN DISPOSABLE) ×2 IMPLANT
GOWN STRL REUS W/TWL LRG LVL3 (GOWN DISPOSABLE) ×2
KIT ROOM TURNOVER OR (KITS) ×2 IMPLANT
LOOP VESSEL RED MINI 1.3X0.9 (MISCELLANEOUS) IMPLANT
LOOPS RED MINI 1.3MMX0.9MM (MISCELLANEOUS)
NS IRRIG 500ML POUR BTL (IV SOLUTION) ×2 IMPLANT
PACK EXTREMITY ARMC (MISCELLANEOUS) ×2 IMPLANT
PAD GROUND ADULT SPLIT (MISCELLANEOUS) ×2 IMPLANT
PADDING CAST 2X4YD ST (MISCELLANEOUS) ×1
PADDING CAST BLEND 2X4 STRL (MISCELLANEOUS) ×1 IMPLANT
SOL PREP PVP 2OZ (MISCELLANEOUS) ×2
SOLUTION PREP PVP 2OZ (MISCELLANEOUS) ×1 IMPLANT
SPLINT CAST 1 STEP 3X12 (MISCELLANEOUS) ×2 IMPLANT
STOCKINETTE 4X48 STRL (DRAPES) ×2 IMPLANT
STRAP BODY AND KNEE 60X3 (MISCELLANEOUS) ×2 IMPLANT
SUT ETHILON 4-0 (SUTURE) ×1
SUT ETHILON 4-0 FS2 18XMFL BLK (SUTURE) ×1
SUTURE ETHLN 4-0 FS2 18XMF BLK (SUTURE) ×1 IMPLANT

## 2015-04-02 NOTE — Transfer of Care (Signed)
Immediate Anesthesia Transfer of Care Note  Patient: Joella PrinceMelissa Artist  Procedure(s) Performed: Procedure(s): OPEN CARPAL TUNNEL RELEASE (Right)  Patient Location: PACU  Anesthesia Type: General LMA  Level of Consciousness: awake, alert  and patient cooperative  Airway and Oxygen Therapy: Patient Spontanous Breathing and Patient connected to supplemental oxygen  Post-op Assessment: Post-op Vital signs reviewed, Patient's Cardiovascular Status Stable, Respiratory Function Stable, Patent Airway and No signs of Nausea or vomiting  Post-op Vital Signs: Reviewed and stable  Complications: No apparent anesthesia complications

## 2015-04-02 NOTE — Op Note (Signed)
DATE OF SURGERY:  04/02/2015  PATIENT NAME:  Stephanie Lyons   DOB: Sep 30, 1980  MRN: [id number]  PRE-OPERATIVE DIAGNOSIS: Right carpal tunnel syndrome  POST-OPERATIVE DIAGNOSIS:  Same  PROCEDURE: Right carpal tunnel release  SURGEON: Dr. Erin SonsHarold Loranda Mastel, Montez HagemanJr. M.D.  ANESTHESIA: Gen.   INDICATIONS FOR SURGERY: Stephanie Lyons is a 35 y.o. year old female with a long history of numbness and paresthesias in the right hand. Nerve conduction studies demonstrated findings consistent with  median nerve compression.The patient had not seen any significant improvement despite conservative nonsurgical intervention. After discussion of the risks and benefits of surgical intervention, the patient expressed understanding of the risks benefits and agree with plans for carpal tunnel release.   PROCEDURE IN DETAIL: The patient was taken to the operating room where satisfactory general anesthesia was achieved. A tourniquet was placed on the patient's right upper arm.The right hand and arm were prepped  and draped in the usual sterile fashion. A "time-out" was performed as per usual protocol. The hand and forearm were exsanguinated using an Esmarch and the tourniquet was inflated to 250 mmHg.  An incision was made just ulnar to the thenar palmar crease. Dissection was carried down through the palmar fascia to the transverse carpal ligament. The transverse carpal ligament was sharply incised, taking care to protect the underlying structures within the carpal tunnel. Complete release of the transverse carpal ligament was achieved. There was no evidence of a mass or proliferative synovitis within the carpal tunnel. The wound was irrigated with saline. The tourniquet was released at this time. It had been up for about 8 minutes. Bleeding was controlled with digital pressure and coagulation cautery. I did inject the subcutaneous tissue of the wound with about 5 cc of 0.5% Marcaine without epinephrine. The skin was then  re-approximated with interrupted sutures of #4-0 nylon. A sterile dressing was applied followed by application of a volar splint.  The patient was awakened and transferred to her stretcher bed.  The patient tolerated the procedure well and was transported to the PACU in stable condition. Blood loss was negligible.  Dr. Isidoro DonningHarold Maryuri Warnke, Jr. M.D.

## 2015-04-02 NOTE — Anesthesia Postprocedure Evaluation (Signed)
Anesthesia Post Note  Patient: Stephanie Lyons  Procedure(s) Performed: Procedure(s) (LRB): OPEN CARPAL TUNNEL RELEASE (Right)  Patient location during evaluation: PACU Anesthesia Type: General Level of consciousness: awake and alert Pain management: pain level controlled Vital Signs Assessment: post-procedure vital signs reviewed and stable Respiratory status: spontaneous breathing, nonlabored ventilation and respiratory function stable Cardiovascular status: blood pressure returned to baseline and stable Postop Assessment: no signs of nausea or vomiting Anesthetic complications: no    DANIEL D KOVACS

## 2015-04-02 NOTE — Discharge Instructions (Signed)
General Anesthesia, Adult, Care After °Refer to this sheet in the next few weeks. These instructions provide you with information on caring for yourself after your procedure. Your health care provider may also give you more specific instructions. Your treatment has been planned according to current medical practices, but problems sometimes occur. Call your health care provider if you have any problems or questions after your procedure. °WHAT TO EXPECT AFTER THE PROCEDURE °After the procedure, it is typical to experience: °· Sleepiness. °· Nausea and vomiting. °HOME CARE INSTRUCTIONS °· For the first 24 hours after general anesthesia: °¨ Have a responsible person with you. °¨ Do not drive a car. If you are alone, do not take public transportation. °¨ Do not drink alcohol. °¨ Do not take medicine that has not been prescribed by your health care provider. °¨ Do not sign important papers or make important decisions. °¨ You may resume a normal diet and activities as directed by your health care provider. °· Change bandages (dressings) as directed. °· If you have questions or problems that seem related to general anesthesia, call the hospital and ask for the anesthetist or anesthesiologist on call. °SEEK MEDICAL CARE IF: °· You have nausea and vomiting that continue the day after anesthesia. °· You develop a rash. °SEEK IMMEDIATE MEDICAL CARE IF:  °· You have difficulty breathing. °· You have chest pain. °· You have any allergic problems. °  °This information is not intended to replace advice given to you by your health care provider. Make sure you discuss any questions you have with your health care provider. °  °Document Released: 04/03/2000 Document Revised: 01/16/2014 Document Reviewed: 04/26/2011 °Elsevier Interactive Patient Education ©2016 [employer] ° °Ice pack  ° °Elevation ° °Keep dressing dry  ° °RTC in about 10 days °

## 2015-04-02 NOTE — H&P (Signed)
  H and P reviewed. No changes. Uploaded at later date. 

## 2015-04-02 NOTE — Anesthesia Procedure Notes (Signed)
Procedure Name: LMA Insertion Date/Time: 04/02/2015 7:42 AM Performed by: Jimmy PicketAMYOT, Rateel Beldin Pre-anesthesia Checklist: Patient identified, Emergency Drugs available, Suction available, Timeout performed and Patient being monitored Patient Re-evaluated:Patient Re-evaluated prior to inductionOxygen Delivery Method: Circle system utilized Preoxygenation: Pre-oxygenation with 100% oxygen Intubation Type: IV induction LMA: LMA inserted LMA Size: 4.0 Number of attempts: 1 Placement Confirmation: positive ETCO2 and breath sounds checked- equal and bilateral Tube secured with: Tape

## 2015-04-02 NOTE — Anesthesia Preprocedure Evaluation (Signed)
Anesthesia Evaluation  Patient identified by MRN, date of birth, ID band Patient awake    Reviewed: Allergy & Precautions, H&P , NPO status , Patient's Chart, lab work & pertinent test results, reviewed documented beta blocker date and time   Airway Mallampati: II  TM Distance: >3 FB Neck ROM: full    Dental no notable dental hx.    Pulmonary asthma , Current Smoker,    Pulmonary exam normal breath sounds clear to auscultation       Cardiovascular Exercise Tolerance: Good negative cardio ROS   Rhythm:regular Rate:Normal     Neuro/Psych Carpal tunnel negative psych ROS   GI/Hepatic negative GI ROS, Neg liver ROS,   Endo/Other  negative endocrine ROS  Renal/GU negative Renal ROS  negative genitourinary   Musculoskeletal   Abdominal   Peds  Hematology negative hematology ROS (+)   Anesthesia Other Findings   Reproductive/Obstetrics negative OB ROS                             Anesthesia Physical Anesthesia Plan  ASA: II  Anesthesia Plan: General LMA   Post-op Pain Management:    Induction:   Airway Management Planned:   Additional Equipment:   Intra-op Plan:   Post-operative Plan:   Informed Consent: I have reviewed the patients History and Physical, chart, labs and discussed the procedure including the risks, benefits and alternatives for the proposed anesthesia with the patient or authorized representative who has indicated his/her understanding and acceptance.     Plan Discussed with: CRNA  Anesthesia Plan Comments:         Anesthesia Quick Evaluation

## 2015-04-05 ENCOUNTER — Encounter: Payer: Self-pay | Admitting: Unknown Physician Specialty

## 2015-07-02 ENCOUNTER — Ambulatory Visit (INDEPENDENT_AMBULATORY_CARE_PROVIDER_SITE_OTHER): Payer: BLUE CROSS/BLUE SHIELD | Admitting: Family Medicine

## 2015-07-02 ENCOUNTER — Encounter: Payer: Self-pay | Admitting: Family Medicine

## 2015-07-02 VITALS — BP 132/78 | HR 73 | Temp 98.4°F | Wt 166.5 lb

## 2015-07-02 DIAGNOSIS — Z0000 Encounter for general adult medical examination without abnormal findings: Secondary | ICD-10-CM | POA: Diagnosis not present

## 2015-07-02 DIAGNOSIS — Z8269 Family history of other diseases of the musculoskeletal system and connective tissue: Secondary | ICD-10-CM

## 2015-07-02 DIAGNOSIS — E663 Overweight: Secondary | ICD-10-CM

## 2015-07-02 DIAGNOSIS — Z840 Family history of diseases of the skin and subcutaneous tissue: Secondary | ICD-10-CM | POA: Diagnosis not present

## 2015-07-02 LAB — COMPREHENSIVE METABOLIC PANEL
ALBUMIN: 4.3 g/dL (ref 3.5–5.2)
ALT: 10 U/L (ref 0–35)
AST: 12 U/L (ref 0–37)
Alkaline Phosphatase: 44 U/L (ref 39–117)
BILIRUBIN TOTAL: 0.5 mg/dL (ref 0.2–1.2)
BUN: 8 mg/dL (ref 6–23)
CO2: 27 mEq/L (ref 19–32)
CREATININE: 0.68 mg/dL (ref 0.40–1.20)
Calcium: 9.2 mg/dL (ref 8.4–10.5)
Chloride: 104 mEq/L (ref 96–112)
GFR: 105.63 mL/min (ref >60.00)
Glucose, Bld: 85 mg/dL (ref 70–99)
Potassium: 3.9 mEq/L (ref 3.5–5.1)
Sodium: 136 mEq/L (ref 135–145)
TOTAL PROTEIN: 7.3 g/dL (ref 6.0–8.3)

## 2015-07-02 LAB — LIPID PANEL
CHOL/HDL RATIO: 4
Cholesterol: 177 mg/dL (ref 0–200)
HDL: 42.8 mg/dL (ref >39.00)
LDL Cholesterol: 111 mg/dL — ABNORMAL HIGH (ref 0–99)
NONHDL: 134.03
Triglycerides: 116 mg/dL (ref 0.0–149.0)
VLDL: 23.2 mg/dL (ref 0.0–40.0)

## 2015-07-02 LAB — CBC
HEMATOCRIT: 46.8 % — AB (ref 36.0–46.0)
HEMOGLOBIN: 15.3 g/dL — AB (ref 12.0–15.0)
MCHC: 32.7 g/dL (ref 30.0–36.0)
MCV: 90.7 fl (ref 78.0–100.0)
Platelets: 384 10*3/uL (ref 150.0–400.0)
RBC: 5.16 Mil/uL — AB (ref 3.87–5.11)
RDW: 13.5 % (ref 11.5–15.5)
WBC: 14.6 10*3/uL — AB (ref 4.0–10.5)

## 2015-07-02 LAB — HEMOGLOBIN A1C: HEMOGLOBIN A1C: 4.8 % (ref 4.6–6.5)

## 2015-07-02 NOTE — Progress Notes (Signed)
Pre visit review using our clinic review tool, if applicable. No additional management support is needed unless otherwise documented below in the visit note. 

## 2015-07-02 NOTE — Patient Instructions (Signed)
We will call about your lab results.  Use the Valtrex at the first sign of a cold sore. Avoid sun exposure.  Consider pap smear.  Follow up annually; I will let you know about the weight loss when labs return.  Take care  Dr. Lacinda Axon   Health Maintenance, Female Adopting a healthy lifestyle and getting preventive care can go a long way to promote health and wellness. Talk with your health care provider about what schedule of regular examinations is right for you. This is a good chance for you to check in with your provider about disease prevention and staying healthy. In between checkups, there are plenty of things you can do on your own. Experts have done a lot of research about which lifestyle changes and preventive measures are most likely to keep you healthy. Ask your health care provider for more information. WEIGHT AND DIET  Eat a healthy diet  Be sure to include plenty of vegetables, fruits, low-fat dairy products, and lean protein.  Do not eat a lot of foods high in solid fats, added sugars, or salt.  Get regular exercise. This is one of the most important things you can do for your health.  Most adults should exercise for at least 150 minutes each week. The exercise should increase your heart rate and make you sweat (moderate-intensity exercise).  Most adults should also do strengthening exercises at least twice a week. This is in addition to the moderate-intensity exercise.  Maintain a healthy weight  Body mass index (BMI) is a measurement that can be used to identify possible weight problems. It estimates body fat based on height and weight. Your health care provider can help determine your BMI and help you achieve or maintain a healthy weight.  For females 2 years of age and older:   A BMI below 18.5 is considered underweight.  A BMI of 18.5 to 24.9 is normal.  A BMI of 25 to 29.9 is considered overweight.  A BMI of 30 and above is considered obese.  Watch levels  of cholesterol and blood lipids  You should start having your blood tested for lipids and cholesterol at 34 years of age, then have this test every 5 years.  You may need to have your cholesterol levels checked more often if:  Your lipid or cholesterol levels are high.  You are older than 33 years of age.  You are at high risk for heart disease.  CANCER SCREENING   Lung Cancer  Lung cancer screening is recommended for adults 22-59 years old who are at high risk for lung cancer because of a history of smoking.  A yearly low-dose CT scan of the lungs is recommended for people who:  Currently smoke.  Have quit within the past 15 years.  Have at least a 30-pack-year history of smoking. A pack year is smoking an average of one pack of cigarettes a day for 1 year.  Yearly screening should continue until it has been 15 years since you quit.  Yearly screening should stop if you develop a health problem that would prevent you from having lung cancer treatment.  Breast Cancer  Practice breast self-awareness. This means understanding how your breasts normally appear and feel.  It also means doing regular breast self-exams. Let your health care provider know about any changes, no matter how small.  If you are in your 20s or 30s, you should have a clinical breast exam (CBE) by a health care provider every 1-3 years  as part of a regular health exam.  If you are 59 or older, have a CBE every year. Also consider having a breast X-ray (mammogram) every year.  If you have a family history of breast cancer, talk to your health care provider about genetic screening.  If you are at high risk for breast cancer, talk to your health care provider about having an MRI and a mammogram every year.  Breast cancer gene (BRCA) assessment is recommended for women who have family members with BRCA-related cancers. BRCA-related cancers include:  Breast.  Ovarian.  Tubal.  Peritoneal  cancers.  Results of the assessment will determine the need for genetic counseling and BRCA1 and BRCA2 testing. Cervical Cancer Your health care provider may recommend that you be screened regularly for cancer of the pelvic organs (ovaries, uterus, and vagina). This screening involves a pelvic examination, including checking for microscopic changes to the surface of your cervix (Pap test). You may be encouraged to have this screening done every 3 years, beginning at age 41.  For women ages 13-65, health care providers may recommend pelvic exams and Pap testing every 3 years, or they may recommend the Pap and pelvic exam, combined with testing for human papilloma virus (HPV), every 5 years. Some types of HPV increase your risk of cervical cancer. Testing for HPV may also be done on women of any age with unclear Pap test results.  Other health care providers may not recommend any screening for nonpregnant women who are considered low risk for pelvic cancer and who do not have symptoms. Ask your health care provider if a screening pelvic exam is right for you.  If you have had past treatment for cervical cancer or a condition that could lead to cancer, you need Pap tests and screening for cancer for at least 20 years after your treatment. If Pap tests have been discontinued, your risk factors (such as having a new sexual partner) need to be reassessed to determine if screening should resume. Some women have medical problems that increase the chance of getting cervical cancer. In these cases, your health care provider may recommend more frequent screening and Pap tests. Colorectal Cancer  This type of cancer can be detected and often prevented.  Routine colorectal cancer screening usually begins at 33 years of age and continues through 33 years of age.  Your health care provider may recommend screening at an earlier age if you have risk factors for colon cancer.  Your health care provider may also  recommend using home test kits to check for hidden blood in the stool.  A small camera at the end of a tube can be used to examine your colon directly (sigmoidoscopy or colonoscopy). This is done to check for the earliest forms of colorectal cancer.  Routine screening usually begins at age 9.  Direct examination of the colon should be repeated every 5-10 years through 33 years of age. However, you may need to be screened more often if early forms of precancerous polyps or small growths are found. Skin Cancer  Check your skin from head to toe regularly.  Tell your health care provider about any new moles or changes in moles, especially if there is a change in a mole's shape or color.  Also tell your health care provider if you have a mole that is larger than the size of a pencil eraser.  Always use sunscreen. Apply sunscreen liberally and repeatedly throughout the [place].  Protect yourself by wearing long sleeves,  pants, a wide-brimmed hat, and sunglasses whenever you are outside. HEART DISEASE, DIABETES, AND HIGH BLOOD PRESSURE   High blood pressure causes heart disease and increases the risk of stroke. High blood pressure is more likely to develop in:  People who have blood pressure in the high end of the normal range (130-139/85-89 mm Hg).  People who are overweight or obese.  People who are African American.  If you are 22-60 years of age, have your blood pressure checked every 3-5 years. If you are 61 years of age or older, have your blood pressure checked every year. You should have your blood pressure measured twice--once when you are at a hospital or clinic, and once when you are not at a hospital or clinic. Record the average of the two measurements. To check your blood pressure when you are not at a hospital or clinic, you can use:  An automated blood pressure machine at a pharmacy.  A home blood pressure monitor.  If you are between 29 years and 55 years old, ask your health  care provider if you should take aspirin to prevent strokes.  Have regular diabetes screenings. This involves taking a blood sample to check your fasting blood sugar level.  If you are at a normal weight and have a low risk for diabetes, have this test once every three years after 33 years of age.  If you are overweight and have a high risk for diabetes, consider being tested at a younger age or more often. PREVENTING INFECTION  Hepatitis B  If you have a higher risk for hepatitis B, you should be screened for this virus. You are considered at high risk for hepatitis B if:  You were born in a country where hepatitis B is common. Ask your health care provider which countries are considered high risk.  Your parents were born in a high-risk country, and you have not been immunized against hepatitis B (hepatitis B vaccine).  You have HIV or AIDS.  You use needles to inject street drugs.  You live with someone who has hepatitis B.  You have had sex with someone who has hepatitis B.  You get hemodialysis treatment.  You take certain medicines for conditions, including cancer, organ transplantation, and autoimmune conditions. Hepatitis C  Blood testing is recommended for:  Everyone born from [birth year] through 1963.  Anyone with known risk factors for hepatitis C. Sexually transmitted infections (STIs)  You should be screened for sexually transmitted infections (STIs) including gonorrhea and chlamydia if:  You are sexually active and are younger than 33 years of age.  You are older than 33 years of age and your health care provider tells you that you are at risk for this type of infection.  Your sexual activity has changed since you were last screened and you are at an increased risk for chlamydia or gonorrhea. Ask your health care provider if you are at risk.  If you do not have HIV, but are at risk, it may be recommended that you take a prescription medicine daily to prevent HIV  infection. This is called pre-exposure prophylaxis (PrEP). You are considered at risk if:  You are sexually active and do not regularly use condoms or know the HIV status of your partner(s).  You take drugs by injection.  You are sexually active with a partner who has HIV. Talk with your health care provider about whether you are at high risk of being infected with HIV. If you choose to  begin PrEP, you should first be tested for HIV. You should then be tested every 3 months for as long as you are taking PrEP.  PREGNANCY   If you are premenopausal and you may become pregnant, ask your health care provider about preconception counseling.  If you may become pregnant, take 400 to 800 micrograms (mcg) of folic acid every day.  If you want to prevent pregnancy, talk to your health care provider about birth control (contraception). OSTEOPOROSIS AND MENOPAUSE   Osteoporosis is a disease in which the bones lose minerals and strength with aging. This can result in serious bone fractures. Your risk for osteoporosis can be identified using a bone density scan.  If you are 10 years of age or older, or if you are at risk for osteoporosis and fractures, ask your health care provider if you should be screened.  Ask your health care provider whether you should take a calcium or vitamin D supplement to lower your risk for osteoporosis.  Menopause may have certain physical symptoms and risks.  Hormone replacement therapy may reduce some of these symptoms and risks. Talk to your health care provider about whether hormone replacement therapy is right for you.  HOME CARE INSTRUCTIONS   Schedule regular health, dental, and eye exams.  Stay current with your immunizations.   Do not use any tobacco products including cigarettes, chewing tobacco, or electronic cigarettes.  If you are pregnant, do not drink alcohol.  If you are breastfeeding, limit how much and how often you drink alcohol.  Limit  alcohol intake to no more than 1 drink per day for nonpregnant women. One drink equals 12 ounces of beer, 5 ounces of wine, or 1 ounces of hard liquor.  Do not use street drugs.  Do not share needles.  Ask your health care provider for help if you need support or information about quitting drugs.  Tell your health care provider if you often feel depressed.  Tell your health care provider if you have ever been abused or do not feel safe at home.   This information is not intended to replace advice given to you by your health care provider. Make sure you discuss any questions you have with your health care provider.   Document Released: 07/11/2010 Document Revised: 01/16/2014 Document Reviewed: 11/27/2012 Elsevier Interactive Patient Education [employer].

## 2015-07-04 ENCOUNTER — Encounter: Payer: Self-pay | Admitting: Family Medicine

## 2015-07-04 DIAGNOSIS — Z8619 Personal history of other infectious and parasitic diseases: Secondary | ICD-10-CM | POA: Insufficient documentation

## 2015-07-04 DIAGNOSIS — F1911 Other psychoactive substance abuse, in remission: Secondary | ICD-10-CM | POA: Insufficient documentation

## 2015-07-04 DIAGNOSIS — Z720 Tobacco use: Secondary | ICD-10-CM | POA: Insufficient documentation

## 2015-07-04 DIAGNOSIS — Z0000 Encounter for general adult medical examination without abnormal findings: Secondary | ICD-10-CM | POA: Insufficient documentation

## 2015-07-04 DIAGNOSIS — Z7689 Persons encountering health services in other specified circumstances: Secondary | ICD-10-CM | POA: Insufficient documentation

## 2015-07-04 NOTE — Assessment & Plan Note (Signed)
Discussed need for pap smear. She wants to weight. Tetanus up to date.  Screening labs today - see orders (desired testing for lupus given recent rash and family hx).

## 2015-07-04 NOTE — Progress Notes (Signed)
Subjective:  Patient ID: Stephanie Lyons, female    DOB: 05-Jan-1981  Age: 35 y.o. MRN: [id number]  CC: Establish care  HPI Stephanie Lyons is a 35 y.o. female presents to the clinic today to establish care.  Preventative Healthcare  Pap smear: Has never had pap smear. Will discuss today.  Immunizations  Tetanus - <3 years ago.  Pneumococcal - In need of given current smoking.  Flu - N/A at this time.   Zoster - Not indicated.  Labs: Screening labs today. Given family history, desired lupus testing.  Exercise: Exercises regularly. Concerned about not being able to lose weight.  Alcohol use: See below.  Smoking/tobacco use: Current smoker.  STD/HIV testing: Has had testing.  Wears seat belt: Yes.   PMH, Surgical Hx, Family Hx, Social History reviewed and updated as below.  Past Medical History  Diagnosis Date  . Neck pain   . Asthma   . Carpal tunnel syndrome, right   . Chicken pox   . Depression    Past Surgical History  Procedure Laterality Date  . Left foot surgery    . Carpal tunnel release Right 04/02/2015    Procedure: OPEN CARPAL TUNNEL RELEASE;  Surgeon: Leanor Kail, MD;  Location: [facility];  Service: Orthopedics;  Laterality: Right;  . Tonsillectomy    . Foot surgery Right    Family History  Problem Relation Age of Onset  . Hypertension Mother   . Hypertension Maternal Grandmother   . Hypertension Maternal Grandfather    Social History  Substance Use Topics  . Smoking status: Current Every Day Smoker -- 0.50 packs/day for 5 years    Types: Cigarettes  . Smokeless tobacco: Never Used  . Alcohol Use: 4.8 oz/week    4 Glasses of wine, 4 Cans of beer per week     Comment: social   Review of Systems  Constitutional:       Weight gain, difficulty losing weight.  Respiratory: Positive for cough.   Skin:       Recent cold sore. Recent facial rash.  All other systems reviewed and are negative.  Objective:   Today's Vitals:  BP 132/78 mmHg  Pulse 73  Temp(Src) 98.4 F (36.9 C) (Oral)  Wt 166 lb 8 oz (75.524 kg)  SpO2 98%  Physical Exam  Constitutional: She is oriented to person, place, and time. She appears well-developed and well-nourished. No distress.  HENT:  Head: Normocephalic and atraumatic.  Nose: Nose normal.  Mouth/Throat: Oropharynx is clear and moist. No oropharyngeal exudate.  Normal TM's bilaterally.   Eyes: Conjunctivae are normal. No scleral icterus.  Neck: Neck supple. No thyromegaly present.  Cardiovascular: Normal rate and regular rhythm.   No murmur heard. Pulmonary/Chest: Effort normal and breath sounds normal. She has no wheezes. She has no rales.  Abdominal: Soft. She exhibits no distension. There is no tenderness. There is no rebound and no guarding.  Musculoskeletal: Normal range of motion. She exhibits no edema.  Lymphadenopathy:    She has no cervical adenopathy.  Neurological: She is alert and oriented to person, place, and time.  Skin: Skin is warm and dry. No rash noted.  Psychiatric:  Anxious.  Vitals reviewed.  Assessment & Plan:   Problem List Items Addressed This Visit    Well woman exam without gynecological exam - Primary    Discussed need for pap smear. She wants to weight. Tetanus up to date.  Screening labs today - see orders (desired testing for lupus given  recent rash and family hx).       Other Visit Diagnoses    Overweight (BMI 25.0-29.9)        Relevant Orders    CBC (Completed)    Comp Met (CMET) (Completed)    Lipid Profile (Completed)    HgB A1c (Completed)    TSH    T4, free    T3, free    Family history of systemic lupus erythematosus        Relevant Orders    Anti-DNA antibody, double-stranded    Antinuclear Antib (ANA)       Outpatient Encounter Prescriptions as of 07/02/2015  Medication Sig  . albuterol (PROVENTIL HFA;VENTOLIN HFA) 108 (90 Base) MCG/ACT inhaler Inhale into the lungs every 6 (six) hours as needed for wheezing or  shortness of breath. Reported on 03/31/2015  . valACYclovir (VALTREX) 500 MG tablet Take by mouth.   No facility-administered encounter medications on file as of 07/02/2015.    Follow-up: Annually or sooner if needed.  [place]

## 2015-07-05 ENCOUNTER — Telehealth: Payer: Self-pay | Admitting: *Deleted

## 2015-07-05 LAB — ANA: Anti Nuclear Antibody(ANA): NEGATIVE

## 2015-07-05 LAB — ANTI-DNA ANTIBODY, DOUBLE-STRANDED: ds DNA Ab: 2 IU/mL

## 2015-07-05 NOTE — Telephone Encounter (Signed)
Notified mother Hilda Lias(Marie) of results.  HIPPA compliant. Verbalized understanding.

## 2015-07-05 NOTE — Telephone Encounter (Signed)
Patient requested her lab results,please call her mother with results, her mother Stephanie Lyons is on her DPR Pt mother Stephanie Lyons [phone]

## 2015-07-06 ENCOUNTER — Other Ambulatory Visit: Payer: Self-pay | Admitting: Family Medicine

## 2015-07-06 ENCOUNTER — Telehealth: Payer: Self-pay | Admitting: Family Medicine

## 2015-07-06 DIAGNOSIS — E669 Obesity, unspecified: Secondary | ICD-10-CM

## 2015-07-06 NOTE — Telephone Encounter (Signed)
Please advise for referral, thanks 

## 2015-07-06 NOTE — Telephone Encounter (Signed)
Pt called wanting to get a referral to see the dietician Dr Alexis GoodellPamela Ingram.   Call pt @ [phone]. Thank you!

## 2015-07-06 NOTE — Telephone Encounter (Signed)
Referral placed.

## 2015-07-14 ENCOUNTER — Other Ambulatory Visit (INDEPENDENT_AMBULATORY_CARE_PROVIDER_SITE_OTHER): Payer: BLUE CROSS/BLUE SHIELD

## 2015-07-14 DIAGNOSIS — E663 Overweight: Secondary | ICD-10-CM

## 2015-07-14 LAB — TSH: TSH: 3.75 u[IU]/mL (ref 0.35–4.50)

## 2015-07-14 LAB — T3, FREE: T3, Free: 3.4 pg/mL (ref 2.3–4.2)

## 2015-07-14 LAB — T4, FREE: Free T4: 0.84 ng/dL (ref 0.60–1.60)

## 2015-08-11 ENCOUNTER — Encounter: Payer: BLUE CROSS/BLUE SHIELD | Attending: Family Medicine | Admitting: Dietician

## 2015-08-11 ENCOUNTER — Encounter: Payer: Self-pay | Admitting: Dietician

## 2015-08-11 VITALS — Ht 62.0 in | Wt 170.5 lb

## 2015-08-11 DIAGNOSIS — E669 Obesity, unspecified: Secondary | ICD-10-CM | POA: Diagnosis not present

## 2015-08-11 NOTE — Patient Instructions (Signed)
   Start keeping a food diary of all you eat; use a website such as calorieking.com to look up calorie values. Or try a phone app or website such as MyFitnessPal or LoseIt, which will track calories for you.   Plan ahead for healthy lunch meals, such as a meal in a jar, or sandwich on whole grain bread with fruit and carrots.   Include 1 protein shake up to once a day -- with fruit and perhaps spinach or carrots, and protein powder or 1 cup of Greek yogurt or peanut butter powder. You can add a small portion of oats or  quinoa

## 2015-08-11 NOTE — Progress Notes (Signed)
Medical Nutrition Therapy: Visit start time: 0900  end time: 1000  Assessment:  Diagnosis: obesity Past medical history: patient reports slightly elevated HbA1C, elevated cholesterol Psychosocial issues/ stress concerns: anxiety, depression. She states she feels much happier now, has a fiance. She feels somewhat down due to difficulty losing weight.  Preferred learning method:  Jill Alexanders . Hands-on   Current weight: 170.5lbs  Height: 5'2" Medications, supplements: reviewed list in chart with patient. She was taking Garcinia Cambogia to help with weight loss, but has stopped until she can make sure it is safe.  Progress and evaluation: Patient reports normal weight for her is about 130lbs. She has gained weight over several years in struggling with emotional health. Weight gain has stopped since she has made some diet changes, but she has not yet begun losing. She has stopped drinking sodas, decreased alcohol, decreased starches. She does report some food aversions, i.e. Chicken. Also her work schedule makes it difficult to eat a full breakfast, and lunch choices are limited, so she sometimes has only 1 meal a day.   Physical activity: active job  Dietary Intake:  Usual eating pattern includes 1-2 meals and 0-1 snacks per day. Dining out frequency: 2 meals per week.  Breakfast: oatmeal biscuit with peanut butter Snack: none Lunch: hamburger, fries; often none due to limited choices at work.  Snack: carrots and Ranch, green apple with caramel (not lately) Supper: 5-6pm lean beef or veggie substitute, some [place] substitutes; limits bread, some vegetables. Likes raw carrots, peaches, cherries, green apples, oranges,  Snack: usually none Beverages: water, occasionally 1-2 glasses wine;   Nutrition Care Education: Topics covered: weight management, hyperlipidemia Basic nutrition: basic food groups, appropriate nutrient balance, appropriate meal and snack schedule, general nutrition  guidelines    Weight control: determining reasonable weight goal, calorie needs for weight loss calculated at 1400kcal, provided guidance while advising 45%CHO, 25%protein, and 30%fat. Discussed easy meal options for breakfast and lunch. Discussed portion control strategies, benefits of tracking food intake. Discussed role of stress in weight gain; also use of Garcinia Cambogia, which has shown to be safe for 12 weeks with uncertainty of safety or effectiveness longer term.  Hyperlipidemia: healthy and unhealthy fats, role of fiber food sources of folate, phytochemicals   Nutritional Diagnosis:  [place]-3.3 Overweight/obesity As related to stress, excess calories.  As evidenced by patient report.  Intervention: Instruction as noted above.   Set goals with patient direction.   Commended patient for changes made thus far, and success at halting weight gain.  Education Materials given:  . Food lists/ Planning A Balanced Meal . Sample meal pattern/ menus . Goals/ instructions   Learner/ who was taught:  . Patient   Level of understanding: Marland Kitchen Verbalizes/ demonstrates competency  Demonstrated degree of understanding via:   Teach back Learning barriers: . None  Willingness to learn/ readiness for change: . Eager, change in progress  Monitoring and Evaluation:  Dietary intake, exercise, and body weight      follow up: patient will schedule after checking work schedule.

## 2016-04-05 ENCOUNTER — Ambulatory Visit (INDEPENDENT_AMBULATORY_CARE_PROVIDER_SITE_OTHER): Payer: Self-pay | Admitting: Orthopedic Surgery

## 2016-04-29 ENCOUNTER — Encounter: Payer: Self-pay | Admitting: Medical Oncology

## 2016-04-29 ENCOUNTER — Emergency Department
Admission: EM | Admit: 2016-04-29 | Discharge: 2016-04-29 | Disposition: A | Discharge status: Home / Self Care | Payer: BLUE CROSS/BLUE SHIELD | Attending: Emergency Medicine | Admitting: Emergency Medicine

## 2016-04-29 DIAGNOSIS — J45909 Unspecified asthma, uncomplicated: Secondary | ICD-10-CM

## 2016-04-29 DIAGNOSIS — J302 Other seasonal allergic rhinitis: Secondary | ICD-10-CM | POA: Insufficient documentation

## 2016-04-29 DIAGNOSIS — F17210 Nicotine dependence, cigarettes, uncomplicated: Secondary | ICD-10-CM | POA: Insufficient documentation

## 2016-04-29 DIAGNOSIS — H60502 Unspecified acute noninfective otitis externa, left ear: Secondary | ICD-10-CM | POA: Insufficient documentation

## 2016-04-29 MED ORDER — CIPROFLOXACIN-HYDROCORTISONE 0.2-1 % OT SUSP: 4.0000 [drp] | Freq: Two times a day (BID) | OTIC | 0 refills | Status: AC

## 2016-04-29 MED ORDER — FLUTICASONE PROPIONATE 50 MCG/ACT NA SUSP: 2.0000 | Freq: Every day | NASAL | 0 refills | Status: DC

## 2016-04-29 MED ORDER — ALBUTEROL SULFATE HFA 108 (90 BASE) MCG/ACT IN AERS: 2.0000 | INHALATION_SPRAY | Freq: Four times a day (QID) | RESPIRATORY_TRACT | 0 refills | Status: DC | PRN

## 2016-04-29 NOTE — ED Triage Notes (Signed)
Pt reports left ear pain x 1 week. 

## 2016-04-29 NOTE — ED Provider Notes (Signed)
[facility] Emergency Department Provider Note  ____________________________________________  Time seen: Approximately 10:09 AM  I have reviewed the triage vital signs and the nursing notes.   HISTORY  Chief Complaint Otalgia    HPI Stephanie Lyons is a 36 y.o. female that presents to emergency department with left ear pain and drainage for one week. Patient states that her ear is tender to touch. She has had multiple outer and inner ear infections in the past. She got her tonsils and adenoids removed for frequent infections and was told that she would have fewer ear infections after surgery. She has not seen an ENT since surgery. No recent illness. She has seasonal allergies. She also has asthma and is out of her inhaler. She does not take prednisone because it makes her depressed but has taken ear drops with steroids without difficulty. She denies fever, shortness of breath, chest pain, nausea, vomiting, abdominal pain.   Past Medical History:  Diagnosis Date  . Asthma   . Carpal tunnel syndrome, right   . Chicken pox   . Depression   . Neck pain     Patient Active Problem List   Diagnosis Date Noted  . Tobacco abuse 07/04/2015  . History of drug abuse 07/04/2015  . History of cold sores 07/04/2015  . Well woman exam without gynecological exam 07/04/2015    Past Surgical History:  Procedure Laterality Date  . CARPAL TUNNEL RELEASE Right 04/02/2015   Procedure: OPEN CARPAL TUNNEL RELEASE;  Surgeon: Erin Sons, MD;  Location: [facility];  Service: Orthopedics;  Laterality: Right;  . FOOT SURGERY Right   . left foot surgery    . TONSILLECTOMY      Prior to Admission medications   Medication Sig Start Date End Date Taking? Authorizing Provider  albuterol (PROVENTIL HFA;VENTOLIN HFA) 108 (90 Base) MCG/ACT inhaler Inhale into the lungs every 6 (six) hours as needed for wheezing or shortness of breath. Reported on 03/31/2015    Historical  Provider, MD  albuterol (PROVENTIL HFA;VENTOLIN HFA) 108 (90 Base) MCG/ACT inhaler Inhale 2 puffs into the lungs every 6 (six) hours as needed for wheezing or shortness of breath. 04/29/16   Enid Derry, PA-C  ciprofloxacin-hydrocortisone (CIPRO HC) otic suspension Place 4 drops into the right ear 2 (two) times daily. 04/29/16 05/06/16  Enid Derry, PA-C  Garcinia Cambogia-Chromium 500-200 MG-MCG TABS Take by mouth.    Historical Provider, MD  valACYclovir (VALTREX) 500 MG tablet Take by mouth. 06/22/15 06/21/16  Historical Provider, MD    Allergies Dust mite extract; Kiwi extract; Morphine and related; Percocet [oxycodone-acetaminophen]; and Prednisone  Family History  Problem Relation Age of Onset  . Hypertension Mother   . Hypertension Maternal Grandmother   . Hypertension Maternal Grandfather     Social History Social History  Substance Use Topics  . Smoking status: Current Every Day Smoker    Packs/day: 0.30    Years: 5.00    Types: Cigarettes  . Smokeless tobacco: Never Used  . Alcohol use 4.8 oz/week    4 Glasses of wine, 4 Cans of beer per week     Comment: social     Review of Systems  Constitutional: No fever/chills Cardiovascular: No chest pain. Respiratory: No SOB. Gastrointestinal: No abdominal pain.  No nausea, no vomiting.  Musculoskeletal: Negative for musculoskeletal pain. Skin: Negative for rash, abrasions, lacerations, ecchymosis. Neurological: Negative for headaches, numbness or tingling   ____________________________________________   PHYSICAL EXAM:  VITAL SIGNS: ED Triage Vitals  Enc  Vitals Group     BP 04/29/16 0920 129/74     Pulse Rate 04/29/16 0920 94     Resp 04/29/16 0920 16     Temp 04/29/16 0924 98.2 F (36.8 C)     Temp Source 04/29/16 0924 Oral     SpO2 04/29/16 0920 99 %     Weight 04/29/16 0920 170 lb (77.1 kg)     Height --      Head Circumference --      Peak Flow --      Pain Score --      Pain Loc --      Pain Edu? --       Excl. in GC? --      Constitutional: Alert and oriented. Well appearing and in no acute distress. Eyes: Conjunctivae are normal. PERRL. EOMI. Head: Atraumatic. ENT:      Ears: Left ear pinna and tragus tender to palpation. White discharge in the left ear canal. Tympanic membranes pearly gray with good landmarks.      Nose: No congestion/rhinnorhea.      Mouth/Throat: Mucous membranes are moist. Oropharynx non-erythematous.   Neck: No stridor.   Cardiovascular: Normal rate, regular rhythm.  Good peripheral circulation. Respiratory: Normal respiratory effort without tachypnea or retractions. Lungs CTAB. Good air entry to the bases with no decreased or absent breath sounds. Musculoskeletal: Full range of motion to all extremities. No gross deformities appreciated. Neurologic:  Normal speech and language. No gross focal neurologic deficits are appreciated.  Skin:  Skin is warm, dry and intact. No rash noted.   ____________________________________________   LABS (all labs ordered are listed, but only abnormal results are displayed)  Labs Reviewed - No data to display ____________________________________________  EKG   ____________________________________________  RADIOLOGY   No results found.  ____________________________________________    PROCEDURES  Procedure(s) performed:    Procedures    Medications - No data to display   ____________________________________________   INITIAL IMPRESSION / ASSESSMENT AND PLAN / ED COURSE  Pertinent labs & imaging results that were available during my care of the patient were reviewed by me and considered in my medical decision making (see chart for details).  Review of the [place] CSRS was performed in accordance of the [facility] prior to dispensing any controlled drugs.     Patient's diagnosis is consistent with otitis externa. Vital signs and exam are reassuring. Patient will be given an inhaler for her asthma. She is  going to follow up with her PCP. Patient will be discharged home with prescriptions for Cipro HC. Patient is to follow up with ENT as directed. Patient is given ED precautions to return to the ED for any worsening or new symptoms.     ____________________________________________  FINAL CLINICAL IMPRESSION(S) / ED DIAGNOSES  Final diagnoses:  Acute otitis externa of left ear, unspecified type  Seasonal allergic rhinitis, unspecified trigger  Uncomplicated asthma, unspecified asthma severity, unspecified whether persistent      NEW MEDICATIONS STARTED DURING THIS VISIT:  New Prescriptions   ALBUTEROL (PROVENTIL HFA;VENTOLIN HFA) 108 (90 BASE) MCG/ACT INHALER    Inhale 2 puffs into the lungs every 6 (six) hours as needed for wheezing or shortness of breath.   CIPROFLOXACIN-HYDROCORTISONE (CIPRO HC) OTIC SUSPENSION    Place 4 drops into the right ear 2 (two) times daily.        This chart was dictated using voice recognition software/Dragon. Despite best efforts to proofread, errors can occur which can change the meaning.  Any change was purely unintentional.    Enid Derry, PA-C 04/29/16 1053    Jene Every, MD 04/29/16 1248

## 2016-05-01 ENCOUNTER — Telehealth: Payer: Self-pay | Admitting: Emergency Medicine

## 2016-05-01 NOTE — Telephone Encounter (Signed)
Patient called asking for cheaper ear drop as the cipro hc is >$300.  Per Morrie Sheldon wagner can call in cortisporin otic3 drops  right ear 3 times a day for 7 days.  Called patient and explained goodrx coupon which will make the med about $33.  Called med to [facility] [place]

## 2016-08-30 ENCOUNTER — Other Ambulatory Visit: Payer: Self-pay | Admitting: Podiatry

## 2016-08-30 ENCOUNTER — Ambulatory Visit (INDEPENDENT_AMBULATORY_CARE_PROVIDER_SITE_OTHER): Payer: Self-pay | Admitting: Podiatry

## 2016-08-30 ENCOUNTER — Encounter: Payer: Self-pay | Admitting: Podiatry

## 2016-08-30 ENCOUNTER — Ambulatory Visit (INDEPENDENT_AMBULATORY_CARE_PROVIDER_SITE_OTHER): Payer: Self-pay

## 2016-08-30 VITALS — BP 123/77 | HR 84 | Resp 176

## 2016-08-30 DIAGNOSIS — M79671 Pain in right foot: Secondary | ICD-10-CM

## 2016-08-30 DIAGNOSIS — M722 Plantar fascial fibromatosis: Secondary | ICD-10-CM

## 2016-08-30 DIAGNOSIS — M79672 Pain in left foot: Secondary | ICD-10-CM

## 2016-08-30 DIAGNOSIS — M2041 Other hammer toe(s) (acquired), right foot: Secondary | ICD-10-CM

## 2016-08-30 DIAGNOSIS — M21619 Bunion of unspecified foot: Secondary | ICD-10-CM

## 2016-08-30 MED ORDER — TRIAMCINOLONE ACETONIDE 10 MG/ML IJ SUSP
10.0000 mg | Freq: Once | INTRAMUSCULAR | Status: AC
  Administered 2016-08-30: 10 mg

## 2016-08-30 MED ORDER — PREDNISONE 10 MG PO TABS: ORAL_TABLET | ORAL | 0 refills | Status: DC

## 2016-08-30 NOTE — Progress Notes (Signed)
   Subjective:    Patient ID: Stephanie Lyons, female    DOB: 08-23-1980, 36 y.o.   MRN: [id number]  HPI Chief Complaint  Patient presents with  . Foot Pain    Bilateral; Bunions; plantar forefoot; bottom and back of heel; pt stated, "Feet turn in when walking; 15 yrs ago had bunion surgery done on left foot in [facility]; feels like walking on bone; can't spread toes apart"       Review of Systems  HENT: Positive for sinus pain and sneezing.   Eyes: Positive for itching.  Musculoskeletal: Positive for arthralgias, gait problem and myalgias.  Neurological: Positive for weakness and numbness.  All other systems reviewed and are negative.      Objective:   Physical Exam        Assessment & Plan:

## 2016-08-31 NOTE — Progress Notes (Signed)
Subjective:    Patient ID: Stephanie Lyons, female   DOB: 36 y.o.   MRN: [id number]   HPI patient presents with extreme foot pain both feet and states that the left foot had previous bunion surgery and she gets pain and that and she complains of both her forefoot and especially her heels bilateral. She is extremely sensitive when I try to move her foot    Review of Systems  All other systems reviewed and are negative.       Objective:  Physical Exam  Constitutional: She appears well-developed and well-nourished.  Cardiovascular: Intact distal pulses.   Pulmonary/Chest: Breath sounds normal.  Musculoskeletal: Normal range of motion.  Neurological: She is alert.  Skin: Skin is warm.  Nursing note and vitals reviewed.  neurovascular status intact muscle strength was adequate range of motion was within normal limits with exquisite discomfort in the heel region bilateral and discomfort when I move the lesser MPJs bilateral and around the first MPJ bilateral. Patient has had previous surgery left which appears to have done well but she just continues to complain of pain around these areas     Assessment:    Casimiro Needle to make complete determination as to what may be causing the amount of discomfort she is in but I do think plantar fasciitis is a big part of her problem     Plan:   H&P and conditions and x-rays reviewed with patient. At this point I injected the plantar fascia bilateral 3 mg Kenalog 5 mill grams Xylocaine and applied fascial brace bilateral gave instructions on anti-inflammatories and placed on a 12 a sterile prep DS Dosepak to try to reduce inflammation and I am sending her for arthritic profile. She'll be seen back 2 weeks to see results  X-rays indicate moderate structural bunion deformity right with hallux interphalangeus and well structured first metatarsal hallux left with good alignment secondary to previous surgery done approximately 10 years ago

## 2016-09-01 ENCOUNTER — Telehealth: Payer: Self-pay | Admitting: Podiatry

## 2016-09-01 ENCOUNTER — Telehealth: Payer: Self-pay | Admitting: *Deleted

## 2016-09-01 MED ORDER — DICLOFENAC SODIUM 75 MG PO TBEC: 75.0000 mg | DELAYED_RELEASE_TABLET | Freq: Two times a day (BID) | ORAL | 1 refills | Status: DC

## 2016-09-01 NOTE — Telephone Encounter (Addendum)
Pt states it is in her chart but she can not take prednisone due to her anxiety and depression.09/01/2016-Dr. Regal ordered Diclofenac 75mg  #50 one tablet bid. I informed pt of Dr. Beverlee Nims orders and instructed pt to be consistent with icing 3-4 times daily for 15-20 minutes/session protecting feet from the cold with cloth. Pt states understanding.

## 2016-09-01 NOTE — Telephone Encounter (Signed)
Pt states she would like to have the cost of the labs she is to have drawn, and she can't get a person to answer the phones at the lab. I told pt the phone number she has is the same we call. I told pt to ask for the cost at the time of her blood draw. Pt states understanding.

## 2016-09-01 NOTE — Telephone Encounter (Signed)
I spoke to a nurse earlier this morning and I wanted to speak to someone about some questions I am.

## 2016-09-05 ENCOUNTER — Telehealth: Payer: Self-pay | Admitting: Podiatry

## 2016-09-05 LAB — ANA, IFA COMPREHENSIVE PANEL
ENA SM AB SER-ACNC: NEGATIVE
SCLERODERMA (SCL-70) (ENA) ANTIBODY, IGG: POSITIVE — AB
SM/RNP: NEGATIVE
SSA (Ro) (ENA) Antibody, IgG: <1
SSB (La) (ENA) Antibody, IgG: <1

## 2016-09-05 LAB — URIC ACID: URIC ACID, SERUM: 3.6 mg/dL (ref 2.5–7.0)

## 2016-09-05 LAB — ANTI-DNA ANTIBODY, DOUBLE-STRANDED: ds DNA Ab: 2 IU/mL

## 2016-09-05 LAB — SEDIMENTATION RATE: SED RATE: 1 mm/h (ref 0–20)

## 2016-09-05 LAB — ANA: Anti Nuclear Antibody(ANA): NEGATIVE

## 2016-09-05 LAB — C-REACTIVE PROTEIN: CRP: 0.9 mg/L (ref <8.0)

## 2016-09-05 LAB — RHEUMATOID FACTOR

## 2016-09-05 NOTE — Telephone Encounter (Addendum)
Pt called for blood work results. Delina states some of the test are still pending on the the blood work drawn yesterday. I told pt, it would take longer thant 24 hours for results to return an we would contact her with the results.09/06/2016-Dr. Regal reviewed labs 09/04/2016, states within normal limits except Scleroderma which is slightly elevated, but since other labs are within normal limits there is nothing to be concerned. Left message to call for results. Pt called and I informed pt of Dr. Beverlee Nims review of results.

## 2016-09-05 NOTE — Telephone Encounter (Signed)
I need a nurse to call me back today about some questions that I need to ask. My number is [phone]. Thank you.

## 2016-09-12 ENCOUNTER — Telehealth: Payer: Self-pay | Admitting: *Deleted

## 2016-09-12 NOTE — Telephone Encounter (Signed)
Pt states she felt something pop on the left foot when checking in at work, and now has bruising and swelling. I told pt to tell the assistant that checks her in and they will x-ray prior to seeing Dr. Charlsie Merlesegal, to begin ice therapy 3-4 times daily for 15-20 minutes each session until seen.

## 2016-09-13 ENCOUNTER — Encounter: Payer: Self-pay | Admitting: Podiatry

## 2016-09-13 ENCOUNTER — Ambulatory Visit (INDEPENDENT_AMBULATORY_CARE_PROVIDER_SITE_OTHER): Payer: Self-pay | Admitting: Podiatry

## 2016-09-13 DIAGNOSIS — M722 Plantar fascial fibromatosis: Secondary | ICD-10-CM

## 2016-09-14 NOTE — Progress Notes (Signed)
Subjective:    Patient ID: Stephanie PrinceMelissa Lyons, female   DOB: 36 y.o.   MRN: [id number]   HPI patient states the left foot still really been bothering her and it hurts now on top of the foot and into the arch    ROS      Objective:  Physical Exam neurovascular status unchanged with patient's blood work indicating no signs of systemic inflammatory  inflammation     Assessment:   Some form of tendinitis or other inflammatory condition      Plan:   Discussed the difficulty of this condition and the pain she is and at this point I dispensed air fracture walker to try to mobilize her left foot for the next 3 weeks as best as possible and instructed on heat ice therapy and reappoint to recheck

## 2016-09-15 ENCOUNTER — Telehealth: Payer: Self-pay | Admitting: Podiatry

## 2016-09-15 NOTE — Telephone Encounter (Signed)
pts mom called and wanted to talk to Dr Charlsie Merlesegal or a nurse to discuss pts condition.Pt told mom we said there was not a lot more we can do and the mom is asking about it.  She said she called yesterday asking for Dr Charlsie Merlesegal to call her back but never got a call.

## 2016-09-15 NOTE — Telephone Encounter (Addendum)
Left message informing pt's mtr, Hilda LiasMarie that pt should remain in the cam walker, and perform icing 15 minutes then warm moist heat to the area alternating 3-4 times, and do 3-4 sessions per day. I would discuss other options with Dr. Charlsie Merlesegal on his return 09/20/2016 and call again.09/20/2016-Dr. Regal states pt has so many areas of pain and normal arthritic panel, feels pt would benefit from allowing areas to calm down by wearing cam boot, ice and heat therapy, then focusing on what continues to bother pt. 09/22/2016-Left message for pt's mtr, Hilda LiasMarie to call for more information from Dr. Charlsie Merlesegal. Pt's mtr, Hilda LiasMarie called and I gave her Dr. Beverlee Nimsegal's recommendation.09/28/2016-Pt states her foot hurts so bad she feels like vomiting. I told pt that if she felt that bad, she should go to the ED or we needed her to get an earlier appt than the currently scheduled, and to continue with the instructions given by Dr. Charlsie Merlesegal and ice therapy 3-4 times a day. Transferred pt to schedulers.

## 2016-10-04 ENCOUNTER — Encounter: Payer: Self-pay | Admitting: Podiatry

## 2016-10-04 ENCOUNTER — Ambulatory Visit (INDEPENDENT_AMBULATORY_CARE_PROVIDER_SITE_OTHER): Payer: Self-pay | Admitting: Podiatry

## 2016-10-04 DIAGNOSIS — M722 Plantar fascial fibromatosis: Secondary | ICD-10-CM

## 2016-10-05 NOTE — Progress Notes (Signed)
Subjective:    Patient ID: Stephanie Lyons, female   DOB: 36 y.o.   MRN: [id number]   HPI patient states she is selling some improvement but she continues to have discomfort in her left heel and arch    ROS      Objective:  Physical Exam neurovascular status unchanged with continued discomfort in the left heel arch with patient and has to stand on her foot for extensive periods of time     Assessment:    Continued fasciitis-like symptoms with no specific area pain     Plan:    H&P condition reviewed and were given a go ahead and try to increase her gabapentin at night to help with the pain she is experiencing and I did go ahead and advised on continued boot usage and we did scan her for custom orthotic devices today

## 2016-10-06 ENCOUNTER — Telehealth: Payer: Self-pay | Admitting: Podiatry

## 2016-10-06 NOTE — Telephone Encounter (Signed)
Pt left message at 949am to cancel order for orthotics.She stated her hours at work have been cut and she cannot afford them and she may be moving out of the area..  I returned call asking pt to please call me back I needed to talk to her in person to cancel the order.

## 2016-10-06 NOTE — Telephone Encounter (Signed)
Pt returned call and wants to cancel orthotics and appt to [facility]. She is moving an hour away and will have to find a closer doctor.She will call if she needs her records. Spoke to [employer] and he is going to note to cancel order for orthotics per pt request.

## 2016-10-19 DIAGNOSIS — E669 Obesity, unspecified: Secondary | ICD-10-CM | POA: Insufficient documentation

## 2016-10-24 DIAGNOSIS — F17210 Nicotine dependence, cigarettes, uncomplicated: Secondary | ICD-10-CM | POA: Insufficient documentation

## 2016-10-24 DIAGNOSIS — R51 Headache: Secondary | ICD-10-CM | POA: Insufficient documentation

## 2016-10-24 DIAGNOSIS — J0100 Acute maxillary sinusitis, unspecified: Secondary | ICD-10-CM | POA: Insufficient documentation

## 2016-10-24 DIAGNOSIS — J45909 Unspecified asthma, uncomplicated: Secondary | ICD-10-CM | POA: Insufficient documentation

## 2016-10-24 DIAGNOSIS — J209 Acute bronchitis, unspecified: Secondary | ICD-10-CM | POA: Insufficient documentation

## 2016-10-24 DIAGNOSIS — Z79899 Other long term (current) drug therapy: Secondary | ICD-10-CM | POA: Insufficient documentation

## 2016-10-24 NOTE — ED Triage Notes (Signed)
Pt in with co sinus pressure and headache since Sunday along with chest pain more when she coughs. Pt is a smoker pt has hx of bronchitis.

## 2016-10-25 ENCOUNTER — Emergency Department
Admission: EM | Admit: 2016-10-25 | Discharge: 2016-10-25 | Disposition: A | Discharge status: Home / Self Care | Payer: Self-pay | Attending: Emergency Medicine | Admitting: Emergency Medicine

## 2016-10-25 ENCOUNTER — Emergency Department: Payer: Self-pay

## 2016-10-25 DIAGNOSIS — J209 Acute bronchitis, unspecified: Secondary | ICD-10-CM

## 2016-10-25 DIAGNOSIS — J0100 Acute maxillary sinusitis, unspecified: Secondary | ICD-10-CM

## 2016-10-25 MED ORDER — BENZONATATE 100 MG PO CAPS
100.0000 mg | ORAL_CAPSULE | Freq: Once | ORAL | Status: AC
  Administered 2016-10-25: 100 mg via ORAL
  Filled 2016-10-25: qty 1

## 2016-10-25 MED ORDER — AZITHROMYCIN 500 MG PO TABS
500.0000 mg | ORAL_TABLET | Freq: Once | ORAL | Status: AC
Start: 2016-10-25 — End: 2016-10-25
  Administered 2016-10-25: 500 mg via ORAL
  Filled 2016-10-25: qty 1

## 2016-10-25 MED ORDER — BENZONATATE 100 MG PO CAPS: 100.0000 mg | ORAL_CAPSULE | Freq: Three times a day (TID) | ORAL | 0 refills | Status: AC | PRN

## 2016-10-25 MED ORDER — AZITHROMYCIN 250 MG PO TABS: ORAL_TABLET | ORAL | 0 refills | Status: AC

## 2016-10-25 NOTE — ED Notes (Signed)

## 2016-10-25 NOTE — ED Provider Notes (Signed)
[facility] Emergency Department Provider Note   First MD Initiated Contact with Patient 10/25/16 0138     (approximate)  I have reviewed the triage vital signs and the nursing notes.   HISTORY  Chief Complaint Cough   HPI Stephanie Lyons is a 36 y.o. female presents to the emergency department withthree-day history of "sinus pressure headaches cough chest congestion. Patient admits to daily tobacco use stating she smokes less than half a pack per day. Patient denies any fever or febrile on presentation.   Past Medical History:  Diagnosis Date  . Asthma   . Carpal tunnel syndrome, right   . Chicken pox   . Depression   . Neck pain     Patient Active Problem List   Diagnosis Date Noted  . Tobacco abuse 07/04/2015  . History of drug abuse 07/04/2015  . History of cold sores 07/04/2015  . Well woman exam without gynecological exam 07/04/2015    Past Surgical History:  Procedure Laterality Date  . CARPAL TUNNEL RELEASE Right 04/02/2015   Procedure: OPEN CARPAL TUNNEL RELEASE;  Surgeon: Erin Sons, MD;  Location: [facility];  Service: Orthopedics;  Laterality: Right;  . FOOT SURGERY Right   . left foot surgery    . TONSILLECTOMY      Prior to Admission medications   Medication Sig Start Date End Date Taking? Authorizing Provider  albuterol (PROVENTIL HFA;VENTOLIN HFA) 108 (90 Base) MCG/ACT inhaler Inhale 2 puffs into the lungs every 6 (six) hours as needed for wheezing or shortness of breath. 04/29/16   Enid Derry, PA-C  diclofenac (VOLTAREN) 75 MG EC tablet Take 1 tablet (75 mg total) by mouth 2 (two) times daily. 09/01/16   Lenn Sink, DPM  gabapentin (NEURONTIN) 300 MG capsule Take by mouth. 04/17/16   [provider]  Garcinia Cambogia-Chromium 500-200 MG-MCG TABS Take by mouth.    [provider]  traMADol (ULTRAM) 50 MG tablet Take by mouth. 03/11/15   [provider]    Allergies Dust mite  extract; Kiwi extract; Morphine and related; Percocet [oxycodone-acetaminophen]; and Prednisone  Family History  Problem Relation Age of Onset  . Hypertension Mother   . Hypertension Maternal Grandmother   . Hypertension Maternal Grandfather     Social History Social History  Substance Use Topics  . Smoking status: Current Every Day Smoker    Packs/day: 0.30    Years: 5.00    Types: Cigarettes  . Smokeless tobacco: Never Used  . Alcohol use 4.8 oz/week    4 Glasses of wine, 4 Cans of beer per week     Comment: social    Review of Systems Constitutional: No fever/chills Eyes: No visual changes. ENT: No sore throat. Cardiovascular: Denies chest pain. Respiratory: positive for cough and dyspnea Gastrointestinal: No abdominal pain.  No nausea, no vomiting.  No diarrhea.  No constipation. Genitourinary: Negative for dysuria. Musculoskeletal: Negative for neck pain.  Negative for back pain. Integumentary: Negative for rash. Neurological: Negative for headaches, focal weakness or numbness.   ____________________________________________   PHYSICAL EXAM:  VITAL SIGNS: ED Triage Vitals [10/24/16 2316]  Enc Vitals Group     BP 113/73     Pulse Rate 89     Resp 18     Temp 98 F (36.7 C)     Temp Source Oral     SpO2 100 %     Weight 74.8 kg (165 lb)     Height 1.575 m (5\' 2" )  Head Circumference      Peak Flow      Pain Score 7     Pain Loc      Pain Edu?      Excl. in GC?     Constitutional: Alert and oriented. Well appearing and in no acute distress. Eyes: Conjunctivae are normal.  Head: Atraumatic. Mouth/Throat: Mucous membranes are moist.  Oropharynx non-erythematous. Neck: No stridor.   Cardiovascular: Normal rate, regular rhythm. Good peripheral circulation. Grossly normal heart sounds. Respiratory: Normal respiratory effort.  No retractions. Lungs CTAB. Gastrointestinal: Soft and nontender. No distention.  Musculoskeletal: No lower extremity  tenderness nor edema. No gross deformities of extremities. Neurologic:  Normal speech and language. No gross focal neurologic deficits are appreciated.  Skin:  Skin is warm, dry and intact. No rash noted. Psychiatric: Mood and affect are normal. Speech and behavior are normal.   EKG  ED ECG REPORT I, [place] N Latroya Ng, the attending physician, personally viewed and interpreted this ECG.   Date: 10/25/2016  EKG Time: 11:16 PM  Rate: 90  Rhythm:normal sinus rhythm  Axis: normal  Intervals:normal  ST&T Change: none  ____________________________________________  RADIOLOGY I, [place] N Shakeita Vandevander, personally viewed and evaluated these images (plain radiographs) as part of my medical decision making, as well as reviewing the written report by the radiologist.  Dg Chest 2 View  Result Date: 10/25/2016 CLINICAL DATA:  Sinus pressure and headaches since Sunday. Chest pain with coughing. Smoker. EXAM: CHEST  2 VIEW COMPARISON:  None. FINDINGS: The heart size and mediastinal contours are within normal limits. Both lungs are clear. The visualized skeletal structures are unremarkable. IMPRESSION: No active cardiopulmonary disease. Electronically Signed   By: Burman NievesWilliam  Stevens M.D.   On: 10/25/2016 01:42      Procedures   ____________________________________________   INITIAL IMPRESSION / ASSESSMENT AND PLAN / ED COURSE  As part of my medical decision making, I reviewed the following data within the electronic MEDICAL RECORD [id number] year old female presenting with above stated history of physical exam consistent with possibly sinusitis, bronchitis less likely pneumonia. Chest x-ray performed revealed no evidence of pneumonia. Patient was given azithromycin and Tessalon [facility] emergency department will be prescribed same for home.    ____________________________________________  FINAL CLINICAL IMPRESSION(S) / ED DIAGNOSES  Final diagnoses:  Acute bronchitis, unspecified organism  Acute  non-recurrent maxillary sinusitis     MEDICATIONS GIVEN DURING THIS VISIT:  Medications  azithromycin (ZITHROMAX) tablet 500 mg (500 mg Oral Given 10/25/16 0301)  benzonatate (TESSALON) capsule 100 mg (100 mg Oral Given 10/25/16 0301)     NEW OUTPATIENT MEDICATIONS STARTED DURING THIS VISIT:  New Prescriptions   No medications on file    Modified Medications   No medications on file    Discontinued Medications   No medications on file     Note:  This document was prepared using Dragon voice recognition software and may include unintentional dictation errors.    Darci CurrentBrown, [place] N, MD 10/25/16 [phone]

## 2016-10-25 NOTE — ED Notes (Signed)
Patient transported to X-ray 

## 2016-10-26 ENCOUNTER — Encounter: Payer: Self-pay | Admitting: Orthotics

## 2017-01-18 DIAGNOSIS — M65352 Trigger finger, left little finger: Secondary | ICD-10-CM | POA: Diagnosis not present

## 2017-01-18 DIAGNOSIS — E669 Obesity, unspecified: Secondary | ICD-10-CM | POA: Diagnosis not present

## 2017-01-29 DIAGNOSIS — M65342 Trigger finger, left ring finger: Secondary | ICD-10-CM | POA: Diagnosis not present

## 2017-01-29 DIAGNOSIS — M6530 Trigger finger, unspecified finger: Secondary | ICD-10-CM | POA: Insufficient documentation

## 2017-04-24 DIAGNOSIS — R531 Weakness: Secondary | ICD-10-CM | POA: Diagnosis not present

## 2017-04-24 DIAGNOSIS — R202 Paresthesia of skin: Secondary | ICD-10-CM | POA: Diagnosis not present

## 2017-04-25 DIAGNOSIS — R531 Weakness: Secondary | ICD-10-CM | POA: Insufficient documentation

## 2017-04-25 DIAGNOSIS — R202 Paresthesia of skin: Secondary | ICD-10-CM | POA: Insufficient documentation

## 2017-05-17 DIAGNOSIS — M65352 Trigger finger, left little finger: Secondary | ICD-10-CM | POA: Diagnosis not present

## 2017-05-23 DIAGNOSIS — M65352 Trigger finger, left little finger: Secondary | ICD-10-CM | POA: Diagnosis not present

## 2017-07-11 DIAGNOSIS — B001 Herpesviral vesicular dermatitis: Secondary | ICD-10-CM | POA: Diagnosis not present

## 2017-08-29 DIAGNOSIS — M25642 Stiffness of left hand, not elsewhere classified: Secondary | ICD-10-CM | POA: Diagnosis not present

## 2017-08-29 DIAGNOSIS — M65352 Trigger finger, left little finger: Secondary | ICD-10-CM | POA: Diagnosis not present

## 2017-10-21 DIAGNOSIS — B009 Herpesviral infection, unspecified: Secondary | ICD-10-CM | POA: Diagnosis not present

## 2017-10-21 DIAGNOSIS — J00 Acute nasopharyngitis [common cold]: Secondary | ICD-10-CM | POA: Diagnosis not present

## 2017-10-22 DIAGNOSIS — J019 Acute sinusitis, unspecified: Secondary | ICD-10-CM | POA: Diagnosis not present

## 2017-10-22 DIAGNOSIS — R05 Cough: Secondary | ICD-10-CM | POA: Diagnosis not present

## 2017-10-22 DIAGNOSIS — J069 Acute upper respiratory infection, unspecified: Secondary | ICD-10-CM | POA: Diagnosis not present

## 2017-10-22 DIAGNOSIS — R509 Fever, unspecified: Secondary | ICD-10-CM | POA: Diagnosis not present

## 2017-11-06 ENCOUNTER — Ambulatory Visit: Payer: BLUE CROSS/BLUE SHIELD | Admitting: Family Medicine

## 2017-11-06 ENCOUNTER — Encounter: Payer: Self-pay | Admitting: Family Medicine

## 2017-11-06 ENCOUNTER — Ambulatory Visit (INDEPENDENT_AMBULATORY_CARE_PROVIDER_SITE_OTHER): Payer: BLUE CROSS/BLUE SHIELD

## 2017-11-06 VITALS — BP 126/90 | HR 73 | Temp 98.3°F | Resp 16 | Ht 62.0 in | Wt 166.4 lb

## 2017-11-06 DIAGNOSIS — F419 Anxiety disorder, unspecified: Secondary | ICD-10-CM | POA: Diagnosis not present

## 2017-11-06 DIAGNOSIS — G8929 Other chronic pain: Secondary | ICD-10-CM

## 2017-11-06 DIAGNOSIS — E669 Obesity, unspecified: Secondary | ICD-10-CM

## 2017-11-06 DIAGNOSIS — M545 Low back pain: Secondary | ICD-10-CM

## 2017-11-06 MED ORDER — DULOXETINE HCL 30 MG PO CPEP: 30.0000 mg | ORAL_CAPSULE | Freq: Every day | ORAL | 3 refills | Status: DC

## 2017-11-06 MED ORDER — GABAPENTIN 300 MG PO CAPS: 300.0000 mg | ORAL_CAPSULE | Freq: Two times a day (BID) | ORAL | 2 refills | Status: DC

## 2017-11-06 MED ORDER — HYDROXYZINE HCL 10 MG PO TABS
10.0000 mg | ORAL_TABLET | Freq: Three times a day (TID) | ORAL | 1 refills | Status: DC | PRN
Start: 2017-11-06 — End: 2017-12-04

## 2017-11-06 NOTE — Progress Notes (Signed)
Subjective:    Patient ID: Stephanie Lyons, female    DOB: May 28, 1980, 37 y.o.   MRN: [id number]  HPI   Patient presents to clinic to establish with new PCP.  Patient has been going to [facility], so we were able to see a lot of her information via care everywhere for our chart.  Patient's main concerns today are her anxiety.  Patient states she has dealt with anxiety for many years.  States she does not like to be in the areas that are very crowded & gets anxious easily.   Patient also has chronic low back pain.  Denies any known injury to the back.  States the pain is aching type pain along the spine.  She has taken gabapentin in the past to help control pain with decent effect.  Is interested in trying this again.  Patient would like to work on losing weight.  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  Patient is a Production designer, theatre/television/film at [employer], she lives with her fianc.  Patient Active Problem List   Diagnosis Date Noted  . Tobacco abuse 07/04/2015  . History of drug abuse (HCC) 07/04/2015  . History of cold sores 07/04/2015  . Well woman exam without gynecological exam 07/04/2015   Social History   Tobacco Use  . Smoking status: Current Every Day Smoker    Packs/day: 0.30    Years: 5.00    Pack years: 1.50    Types: Cigarettes  . Smokeless tobacco: Never Used  Substance Use Topics  . Alcohol use: Yes    Alcohol/week: 8.0 standard drinks    Types: 4 Glasses of wine, 4 Cans of beer per week    Comment: social   Family History  Problem Relation Age of Onset  . Hypertension Mother   . Hypertension Maternal Grandmother   . Hypertension Maternal Grandfather    Past Surgical History:  Procedure Laterality Date  . CARPAL TUNNEL RELEASE Right 04/02/2015   Procedure: OPEN CARPAL TUNNEL RELEASE;  Surgeon: Erin Sons, MD;  Location: [facility];  Service: Orthopedics;  Laterality: Right;  . FOOT SURGERY Right   . HAND SURGERY      R- hand 2019, L-hand (2x) 2019  . left foot surgery    . SHOULDER SURGERY    . TONSILLECTOMY     Review of Systems  Constitutional: Negative for chills, fatigue and fever.  HENT: Negative for congestion, ear pain, sinus pain and sore throat.   Eyes: Negative.   Respiratory: Negative for cough, shortness of breath and wheezing.   Cardiovascular: Negative for chest pain, palpitations and leg swelling.  Gastrointestinal: Negative for abdominal pain, diarrhea, nausea and vomiting.  Genitourinary: Negative for dysuria, frequency and urgency.  Musculoskeletal: chronic low back pain Skin: Negative for color change, pallor and rash.  Neurological: Negative for syncope, light-headedness and headaches.  Psychiatric/Behavioral: The patient is nervous/anxious.      Objective:   Physical Exam  Constitutional: She appears well-developed and well-nourished. No distress.  Head: Normocephalic and atraumatic.  Eyes: Pupils are equal, round, and reactive to light. EOM are normal. No scleral icterus.  Neck: Normal range of motion. Neck supple. No tracheal deviation present.  Cardiovascular: Normal rate, regular rhythm and normal heart sounds.  Pulmonary/Chest: Effort normal and breath sounds normal. No respiratory distress. She has no wheezes. She has no rales.  Abdominal: Soft. Bowel sounds are normal. There is no tenderness.  Neurological: She is alert and oriented  to person, place, and time. Gait normal. Grips and dorsiplantar flexion equal and strong.   Musculoskeletal: Able to bend forward and backward, side to side, twist side to side.  Tenderness along lumbar spine with palpation.  Skin: Skin is warm and dry. No pallor.  Psychiatric: She has a normal mood and affect. Her behavior is normal. Thought content normal.   Nursing note and vitals reviewed.    Vitals:   11/06/17 1405  BP: 126/90  Pulse: 73  Resp: 16  Temp: 98.3 F (36.8 C)  SpO2: 98%   Body mass index is 30.43  kg/m.  Assessment & Plan:   Anxiety - patient will begin Cymbalta 30 mg once daily.  She will also have hydroxyzine prescription to use as needed for breakthrough anxiety.  Discussed different techniques to help calm anxiety including deep breathing, going for a walk, trying to think positive.  Chronic low back pain- we will restart patient back on low-dose gabapentin twice daily.  I am also hopeful that Cymbalta will help improve both her anxiety and help back pain.  Back stretching exercises discussed and patient encouraged to do them every day.  Obesity -patient would like to work on eating better and getting more exercise.  Patient given handout outlining a low-carb diet she can try to follow with different food ideas to help with weight loss.  Patient will follow-up in approximately 3 weeks for recheck on mood after starting Cymbalta.

## 2017-11-06 NOTE — Patient Instructions (Signed)
This is  Dr. Tullo's  example of a  "Low GI"  Diet:  It will allow you to lose 4 to 8  lbs  per month if you follow it carefully.  Your goal with exercise is a minimum of 30 minutes of aerobic exercise 5 days per week (Walking does not count once it becomes easy!)    All of the foods can be found at grocery stores and in bulk at [employer].  The Atkins protein bars and shakes are available in more varieties at Target, WalMart and Lowe's Foods.     7 AM Breakfast:  Choose from the following:  Low carbohydrate Protein  Shakes (I recommend the  Premier Protein chocolate shakes,  EAS AdvantEdge "Carb Control" shakes  Or the Atkins shakes all are under 3 net carbs)     a scrambled egg/bacon/cheese burrito made with Mission's "carb balance" whole wheat tortilla  (about 10 net carbs )  Jimmy Deans sells microwaveable frittata (basically a quiche without the pastry crust) that is eaten cold and very convenient way to get your eggs.  8 carbs)  If you make your own protein shakes, avoid bananas and pineapple,  And use low carb greek yogurt or original /unsweetened almond or soy milk    Avoid cereal and bananas, oatmeal and cream of wheat and grits. They are loaded with carbohydrates!   10 AM: high protein snack:  Protein bar by Atkins (the snack size, under 200 cal, usually < 6 net carbs).    A stick of cheese:  Around 1 carb,  100 cal     Dannon Light n Fit Greek Yogurt  (80 cal, 8 carbs)  Other so called "protein bars" and Greek yogurts tend to be loaded with carbohydrates.  Remember, in food advertising, the word "energy" is synonymous for " carbohydrate."  Lunch:   A Sandwich using the bread choices listed, Can use any  Eggs,  lunchmeat, grilled meat or canned tuna), avocado, regular mayo/mustard  and cheese.  A Salad using blue cheese, ranch,  Goddess or vinagrette,  Avoid taco shells, croutons or "confetti" and no "candied nuts" but regular nuts OK.   No pretzels, nabs  or chips.  Pickles and  miniature sweet peppers are a good low carb alternative that provide a "crunch"  The bread is the only source of carbohydrate in a sandwich and  can be decreased by trying some of the attached alternatives to traditional loaf bread   Avoid "Low fat dressings, as well as [place] and [place] dressings They are loaded with sugar!   3 PM/ Mid day  Snack:  Consider  1 ounce of  almonds, walnuts, pistachios, pecans, peanuts,  Macadamia nuts or a nut medley.  Avoid "granola and granola bars "  Mixed nuts are ok in moderation as long as there are no raisins,  cranberries or dried fruit.   KIND bars are OK if you get the low glycemic index variety   Try the prosciutto/mozzarella cheese sticks by Fiorruci  In deli /backery section   High protein      6 PM  Dinner:     Meat/fowl/fish with a green salad, and either broccoli, cauliflower, green beans, spinach, brussel sprouts or  Lima beans. DO NOT BREAD THE PROTEIN!!      There is a low carb pasta by Dreamfield's that is acceptable and tastes great: only 5 digestible carbs/serving.( All grocery stores but BJs carry it ) Several ready made meals are   available low carb:   Try Michel Angelo's chicken piccata or chicken or eggplant parm over low carb pasta.(Lowes and BJs)   Aaron Sanchez's "Carnitas" (pulled pork, no sauce,  0 carbs) or his beef pot roast to make a dinner burrito (at BJ's)  Pesto over low carb pasta (bj's sells a good quality pesto in the center refrigerated section of the deli   Try satueeing  Bok Choy with mushroooms as a good side   Green Giant makes a mashed cauliflower that tastes like mashed potatoes  Whole wheat pasta is still full of digestible carbs and  Not as low in glycemic index as Dreamfield's.   Brown rice is still rice,  So skip the rice and noodles if you eat Chinese or Thai (or at least limit to 1/2 cup)  9 PM snack :   Breyer's "low carb" fudgsicle or  ice cream bar (Carb Smart line), or  Weight Watcher's ice  cream bar , or another "no sugar added" ice cream;  a serving of fresh berries/cherries with whipped cream   Cheese or DANNON'S LlGHT N FIT GREEK YOGURT  8 ounces of [place] unsweetened almond/cococunut milk    Treat yourself to a parfait made with whipped cream blueberiies, walnuts and vanilla greek yogurt  Avoid bananas, pineapple, grapes  and watermelon on a regular basis because they are high in sugar.  THINK OF THEM AS DESSERT  Remember that snack Substitutions should be less than 10 NET carbs per serving and meals < 20 carbs. Remember to subtract fiber grams to get the "net carbs."   

## 2017-11-07 ENCOUNTER — Telehealth: Payer: Self-pay | Admitting: Family Medicine

## 2017-11-07 ENCOUNTER — Encounter: Payer: Self-pay | Admitting: Family Medicine

## 2017-11-07 NOTE — Telephone Encounter (Signed)
Copied from CRM [id number]. Topic: General - Call Back - No Documentation >> Nov 07, 2017  1:41 PM Baldo Daub L wrote: Reason for CRM:   Pt called - states she just missed a call.

## 2017-12-04 ENCOUNTER — Encounter: Payer: Self-pay | Admitting: Podiatry

## 2017-12-04 ENCOUNTER — Ambulatory Visit: Payer: BLUE CROSS/BLUE SHIELD | Admitting: Podiatry

## 2017-12-04 ENCOUNTER — Ambulatory Visit (INDEPENDENT_AMBULATORY_CARE_PROVIDER_SITE_OTHER): Payer: BLUE CROSS/BLUE SHIELD

## 2017-12-04 DIAGNOSIS — M2011 Hallux valgus (acquired), right foot: Secondary | ICD-10-CM

## 2017-12-04 DIAGNOSIS — M2041 Other hammer toe(s) (acquired), right foot: Secondary | ICD-10-CM | POA: Diagnosis not present

## 2017-12-04 MED ORDER — COLCHICINE 0.6 MG PO TABS: 0.6000 mg | ORAL_TABLET | Freq: Every day | ORAL | 0 refills | Status: DC

## 2017-12-04 NOTE — Patient Instructions (Signed)
Pre-Operative Instructions  Congratulations, you have decided to take an important step towards improving your quality of life.  You can be assured that the doctors and staff at [facility] will be with you every step of the way.  Here are some important things you should know:  1. Plan to be at the surgery center/hospital at least 1 (one) hour prior to your scheduled time, unless otherwise directed by the surgical center/hospital staff.  You must have a responsible adult accompany you, remain during the surgery and drive you home.  Make sure you have directions to the surgical center/hospital to ensure you arrive on time. 2. If you are having surgery at [facility] or [facility] hospitals, you will need a copy of your medical history and physical form from your family physician within one month prior to the date of surgery. We will give you a form for your primary physician to complete.  3. We make every effort to accommodate the date you request for surgery.  However, there are times where surgery dates or times have to be moved.  We will contact you as soon as possible if a change in schedule is required.   4. No aspirin/ibuprofen for one week before surgery.  If you are on aspirin, any non-steroidal anti-inflammatory medications (Mobic, Aleve, Ibuprofen) should not be taken seven (7) days prior to your surgery.  You make take Tylenol for pain prior to surgery.  5. Medications - If you are taking daily heart and blood pressure medications, seizure, reflux, allergy, asthma, anxiety, pain or diabetes medications, make sure you notify the surgery center/hospital before the day of surgery so they can tell you which medications you should take or avoid the day of surgery. 6. No food or drink after midnight the night before surgery unless directed otherwise by surgical center/hospital staff. 7. No alcoholic beverages 24-hours prior to surgery.  No smoking 24-hours prior or 24-hours after  surgery. 8. Wear loose pants or shorts. They should be loose enough to fit over bandages, boots, and casts. 9. Don't wear slip-on shoes. Sneakers are preferred. 10. Bring your boot with you to the surgery center/hospital.  Also bring crutches or a walker if your physician has prescribed it for you.  If you do not have this equipment, it will be provided for you after surgery. 11. If you have not been contacted by the surgery center/hospital by the day before your surgery, call to confirm the date and time of your surgery. 12. Leave-time from work may vary depending on the type of surgery you have.  Appropriate arrangements should be made prior to surgery with your employer. 13. Prescriptions will be provided immediately following surgery by your doctor.  Fill these as soon as possible after surgery and take the medication as directed. Pain medications will not be refilled on weekends and must be approved by the doctor. 14. Remove nail polish on the operative foot and avoid getting pedicures prior to surgery. 15. Wash the night before surgery.  The night before surgery wash the foot and leg well with water and the antibacterial soap provided. Be sure to pay special attention to beneath the toenails and in between the toes.  Wash for at least three (3) minutes. Rinse thoroughly with water and dry well with a towel.  Perform this wash unless told not to do so by your physician.  Enclosed: 1 Ice pack (please put in freezer the night before surgery)   1 Hibiclens skin cleaner     Pre-op instructions  If you have any questions regarding the instructions, please do not hesitate to call our office.  [place]: [address], [place], [place] [address] -- [phone]  [place]: [address] [place], [place] [address] -- [phone]  [place]: [address]  [place], [place] [address] -- [phone]  [place]: [address], [address], [place], [place] [address] -- [phone]  Website:  [email or web address] 

## 2017-12-05 NOTE — Progress Notes (Signed)
   Subjective: 37 year old female presenting today complaining of stabbing pain of the right foot secondary to a bunion that has been present for the past several years. She states the pain is progressively worsening and is now having difficulty bending and flexing the foot. She also reports a flare up of right heel pain that began a few weeks ago. Walking and wearing shoes increases her pain. She has been using a toe splint, taking Gabapentin and Tramadol and using ice and heat therapy with no significant relief. Patient is here for further evaluation and treatment.   Past Medical History:  Diagnosis Date  . Asthma   . Carpal tunnel syndrome, right   . Chicken pox   . Depression   . Neck pain       Objective: Physical Exam General: The patient is alert and oriented x3 in no acute distress.  Dermatology: Skin is cool, dry and supple bilateral lower extremities. Negative for open lesions or macerations.  Vascular: Palpable pedal pulses bilaterally. No edema or erythema noted. Capillary refill within normal limits.  Neurological: Epicritic and protective threshold grossly intact bilaterally.   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 Clinical evidence of bunion deformity noted to the respective foot. There is moderate pain on palpation range of motion of the first MPJ. Lateral deviation of the hallux noted consistent with hallux abductovalgus.  Radiographic Exam: Increased intermetatarsal angle greater than 15 with a hallux abductus angle greater than 30 noted on AP view. Moderate degenerative changes noted within the first MPJ.  Assessment: 1. HAV w/ bunion deformity right 2. 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 Patient was evaluated. X-Rays reviewed. 2. Today we discussed the conservative versus surgical management of the presenting pathology. The patient opts for surgical management. All possible complications and  details of the procedure were explained. All patient questions were answered. No guarantees were expressed or implied. 3. Authorization for surgery was initiated today. Surgery will consist of bunionectomy with double osteotomy right; MPJ capsulotomy with weil osteotomy digits 2 and 3 right.  4. Prescription for Colchicine 0.6 mg #10 provided to patient.  5. Return to clinic one week post op.   Works at [employer] as a Production designer, theatre/television/filmmanager.      Felecia ShellingBrent M. , DPM [facility]  Dr. Felecia ShellingBrent M. , DPM    [address]                                        [place], [place] [address]                Office [phone]  Fax [phone]

## 2017-12-10 ENCOUNTER — Telehealth: Payer: Self-pay | Admitting: *Deleted

## 2017-12-10 NOTE — Telephone Encounter (Signed)
"  I was calling regards to scheduling a surgery coming up in January.  I was seen by Dr. Logan BoresEvans in [place].  If you could, please give me a call back and see what we can do in January."

## 2017-12-11 NOTE — Telephone Encounter (Signed)
"  I am calling to schedule my surgery for January with Dr. Logan BoresEvans."  Do you have a date in mine?  "I'd like to do it the first couple of weeks in January or whatever he has available."  Dr. Logan BoresEvans' next available date is January 23.  "Okay, put me down."  Someone from the surgical center will call you a day or two prior to your surgery date.  They will give you your arrival time.  You need to register with the surgical center, the instructions are in the brochure that we gave you.  "Okay, I will take care of it.  So, I'm done with you , correct?"  Yes, that is correct.

## 2017-12-24 ENCOUNTER — Telehealth: Payer: Self-pay | Admitting: *Deleted

## 2017-12-24 NOTE — Telephone Encounter (Signed)
"  I am calling to make sure I haven't missed any calls from anyone.  I have an upcoming surgery coming up."  I have not called you.  I'm not sure if anyone from the surgical center has called.  I was getting ready to call you.  I was going to call you and see if we can reschedule your surgery to January 24 instead of January 23.  "Sure, that will be fine.  It will only be one day.  My last day of working is January 22."  I'll get it rescheduled.  "When will I find out how much I'll have to pay out of pocket?"  I'll get [name] in our insurance department to give you a call but you will need to call the surgical center to get the facility and anesthesia fees.  Their phone number is [phone] and you can find it on the back of the surgery center brochure that we gave you.  "Okay, I will do that, thank you so much."  I called Renee at the surgical center and rescheduled her surgery to January 24 at 2 pm.

## 2018-01-03 ENCOUNTER — Telehealth: Payer: Self-pay | Admitting: *Deleted

## 2018-01-03 NOTE — Telephone Encounter (Signed)
"  I have Stephanie [employer]uerbach[employer]a now through my job.  I will no longer have BCBS.  I don't have the card yet but my Member ID is [id number], group number is [id number].  I left a message for [name] to give me a call and let me know how much I will have to pay out of pocket."  I will let everyone know.

## 2018-01-07 ENCOUNTER — Telehealth: Payer: Self-pay | Admitting: *Deleted

## 2018-01-07 NOTE — Telephone Encounter (Signed)
"  I spoke to you last week.  This phone call is in regards of my insurance for my surgery on January 24.  If you could please give me a call back.  Hopefully we can get going with it."

## 2018-01-24 NOTE — Telephone Encounter (Signed)
I received a call from someone and I'm not sure of who it was from."  I did not call you.  It may have been someone from the surgical center.  You can give them a call.  "What's their phone number?"  Their phone number is [phone].  "Thank you, I'll give them a call."

## 2018-02-01 ENCOUNTER — Encounter: Payer: Self-pay | Admitting: Podiatry

## 2018-02-01 DIAGNOSIS — M7751 Other enthesopathy of right foot: Secondary | ICD-10-CM

## 2018-02-01 DIAGNOSIS — M2021 Hallux rigidus, right foot: Secondary | ICD-10-CM | POA: Diagnosis not present

## 2018-02-05 ENCOUNTER — Other Ambulatory Visit: Payer: Commercial Managed Care - PPO

## 2018-02-08 ENCOUNTER — Other Ambulatory Visit: Payer: Commercial Managed Care - PPO

## 2018-02-12 ENCOUNTER — Ambulatory Visit (INDEPENDENT_AMBULATORY_CARE_PROVIDER_SITE_OTHER): Payer: Commercial Managed Care - PPO

## 2018-02-12 ENCOUNTER — Ambulatory Visit (INDEPENDENT_AMBULATORY_CARE_PROVIDER_SITE_OTHER): Payer: Commercial Managed Care - PPO | Admitting: Podiatry

## 2018-02-12 ENCOUNTER — Encounter: Payer: Self-pay | Admitting: Podiatry

## 2018-02-12 DIAGNOSIS — M2011 Hallux valgus (acquired), right foot: Secondary | ICD-10-CM

## 2018-02-12 DIAGNOSIS — Z9889 Other specified postprocedural states: Secondary | ICD-10-CM

## 2018-02-12 MED ORDER — HYDROCODONE-ACETAMINOPHEN 5-325 MG PO TABS: 1.0000 | ORAL_TABLET | Freq: Four times a day (QID) | ORAL | 0 refills | Status: DC | PRN

## 2018-02-15 NOTE — Progress Notes (Signed)
   Subjective:  Patient presents today status post bunionectomy and hammertoe repair right. DOS: 01/31/2018. She states she is doing well. She reports some mild tenderness today. She has been using the CAM boot and taking Vicodin as directed. There are no modifying factors noted. Patient is here for further evaluation and treatment.    Past Medical History:  Diagnosis Date  . Asthma   . Carpal tunnel syndrome, right   . Chicken pox   . Depression   . Neck pain       Objective/Physical Exam Neurovascular status intact.  Skin incisions appear to be well coapted with sutures and staples intact. No sign of infectious process noted. No dehiscence. No active bleeding noted. Moderate edema noted to the surgical extremity.  Radiographic Exam:  Orthopedic hardware and osteotomies sites appear to be stable with routine healing.  Assessment: 1. s/p bunionectomy and hammertoe repair right. DOS: 01/31/2018   Plan of Care:  1. Patient was evaluated. X-rays reviewed 2. Dressing changed.  3. Continue weightbearing in CAM boot.  4. Refill prescription for Vicodin 5/325 mg provided to patient.  5. Return to clinic in one week.    Felecia Shelling, DPM [facility]  Dr. Felecia Shelling, DPM    [address]                                        [place], [place] [address]                Office [phone]  Fax [phone]

## 2018-02-19 ENCOUNTER — Ambulatory Visit (INDEPENDENT_AMBULATORY_CARE_PROVIDER_SITE_OTHER): Payer: Commercial Managed Care - PPO | Admitting: Podiatry

## 2018-02-19 DIAGNOSIS — M2041 Other hammer toe(s) (acquired), right foot: Secondary | ICD-10-CM

## 2018-02-19 DIAGNOSIS — Z9889 Other specified postprocedural states: Secondary | ICD-10-CM

## 2018-02-19 DIAGNOSIS — M2011 Hallux valgus (acquired), right foot: Secondary | ICD-10-CM

## 2018-02-24 NOTE — Progress Notes (Signed)
   Subjective:  Patient presents today status post bunionectomy and hammertoe repair right. DOS: 01/31/2018. She reports continued pain to the dorsomedial aspect of the foot. She states the pain radiates up her shin. There are no modifying factors noted. She has been taking Vicodin and using the CAM boot as directed. Patient is here for further evaluation and treatment.    Past Medical History:  Diagnosis Date  . Asthma   . Carpal tunnel syndrome, right   . Chicken pox   . Depression   . Neck pain       Objective/Physical Exam Neurovascular status intact.  Skin incisions appear to be well coapted with sutures and staples intact. No sign of infectious process noted. No dehiscence. No active bleeding noted. Moderate edema noted to the surgical extremity.  Assessment: 1. s/p bunionectomy and hammertoe repair right. DOS: 01/31/2018   Plan of Care:  1. Patient was evaluated.  2. Sutures removed.  3. Continue weightbearing in CAM boot.  4. Continue taking Vicodin 5/325 mg.  5. Return to clinic in 2 weeks.   Felecia Shelling, DPM [facility]  Dr. Felecia Shelling, DPM    [address]                                        [place], [place] [address]                Office [phone]  Fax [phone]

## 2018-03-05 ENCOUNTER — Ambulatory Visit (INDEPENDENT_AMBULATORY_CARE_PROVIDER_SITE_OTHER): Payer: Commercial Managed Care - PPO | Admitting: Podiatry

## 2018-03-05 ENCOUNTER — Other Ambulatory Visit: Payer: Self-pay

## 2018-03-05 ENCOUNTER — Ambulatory Visit (INDEPENDENT_AMBULATORY_CARE_PROVIDER_SITE_OTHER): Payer: Commercial Managed Care - PPO

## 2018-03-05 DIAGNOSIS — M2011 Hallux valgus (acquired), right foot: Secondary | ICD-10-CM

## 2018-03-05 DIAGNOSIS — Z9889 Other specified postprocedural states: Secondary | ICD-10-CM

## 2018-03-06 NOTE — Progress Notes (Signed)
   Subjective:  Patient presents today status post bunionectomy and hammertoe repair right. DOS: 01/31/2018. She reports continued sharp pain on the bottom of the foot. There are no modifying factors noted. She has been using the CAM boot as directed. Patient is here for further evaluation and treatment.    Past Medical History:  Diagnosis Date  . Asthma   . Carpal tunnel syndrome, right   . Chicken pox   . Depression   . Neck pain       Objective/Physical Exam Neurovascular status intact.  Skin incisions appear to be well coapted. No sign of infectious process noted. No dehiscence. No active bleeding noted. Moderate edema noted to the surgical extremity.  Radiographic Exam:  Orthopedic hardware and osteotomies sites appear to be stable with routine healing.  Assessment: 1. s/p bunionectomy and hammertoe repair right. DOS: 01/31/2018   Plan of Care:  1. Patient was evaluated. X-Rays reviewed.  2. Discontinue using CAM boot. Post op shoe dispensed.  3. Physical therapy at [facility] ordered for three times weekly for four weeks.  4. Return to clinic in 4 weeks.    Felecia Shelling, DPM [facility]  Dr. Felecia Shelling, DPM    [address]                                        [place], [place] [address]                Office [phone]  Fax [phone]

## 2018-03-29 ENCOUNTER — Encounter: Payer: Self-pay | Admitting: Podiatry

## 2018-04-02 ENCOUNTER — Other Ambulatory Visit: Payer: Self-pay

## 2018-04-02 ENCOUNTER — Encounter: Payer: Self-pay | Admitting: Podiatry

## 2018-04-02 ENCOUNTER — Ambulatory Visit (INDEPENDENT_AMBULATORY_CARE_PROVIDER_SITE_OTHER): Payer: Self-pay | Admitting: Podiatry

## 2018-04-02 ENCOUNTER — Ambulatory Visit (INDEPENDENT_AMBULATORY_CARE_PROVIDER_SITE_OTHER): Payer: Commercial Managed Care - PPO

## 2018-04-02 DIAGNOSIS — M2011 Hallux valgus (acquired), right foot: Secondary | ICD-10-CM

## 2018-04-02 DIAGNOSIS — Z9889 Other specified postprocedural states: Secondary | ICD-10-CM

## 2018-04-02 NOTE — Progress Notes (Signed)
   Subjective:  Patient presents today status post bunionectomy and hammertoe repair right. DOS: 01/31/2018.  Patient continues to experience pain and stiffness to the surgical foot.  She is currently going through physical therapy which she does say helps.  She is currently unable to put on a closed toed shoe at the moment.  She presents for further treatment evaluation wearing the postoperative shoe today.  Past Medical History:  Diagnosis Date  . Asthma   . Carpal tunnel syndrome, right   . Chicken pox   . Depression   . Neck pain       Objective/Physical Exam Neurovascular status intact.  Skin incisions appear to be well coapted. No sign of infectious process noted. No dehiscence. No active bleeding noted. Moderate edema noted to the surgical extremity.  Limited range of motion to the MTPJ's as well as significant pain and tenderness to palpation and range of motion noted.  Radiographic Exam:  Orthopedic hardware and osteotomies sites appear to be stable with routine healing.  Assessment: 1. s/p bunionectomy and hammertoe repair right. DOS: 01/31/2018   Plan of Care:  1. Patient was evaluated. X-Rays reviewed.  2.  Continue physical therapy 2 times per week 3.  Today we can extend the patient's return to work.  Patient to be scheduled to return to work 05/10/2018.  The meantime we need to continue physical therapy and continue improvement in pain management prior to return to work 4.  Continue transitioning from the postoperative shoe to good supportive sneakers 5.  Return to clinic in 5 weeks just prior to return to work date.  Felecia Shelling, DPM [facility]  Dr. Felecia Shelling, DPM    [address]                                        [place], [place] [address]                Office [phone]  Fax [phone]

## 2018-04-16 ENCOUNTER — Encounter: Payer: Self-pay | Admitting: Podiatry

## 2018-04-16 ENCOUNTER — Other Ambulatory Visit: Payer: Self-pay

## 2018-04-16 ENCOUNTER — Ambulatory Visit (INDEPENDENT_AMBULATORY_CARE_PROVIDER_SITE_OTHER): Payer: Commercial Managed Care - PPO | Admitting: Podiatry

## 2018-04-16 ENCOUNTER — Ambulatory Visit (INDEPENDENT_AMBULATORY_CARE_PROVIDER_SITE_OTHER): Payer: Commercial Managed Care - PPO

## 2018-04-16 VITALS — Temp 97.6°F

## 2018-04-16 DIAGNOSIS — M2011 Hallux valgus (acquired), right foot: Secondary | ICD-10-CM | POA: Diagnosis not present

## 2018-04-16 DIAGNOSIS — Z9889 Other specified postprocedural states: Secondary | ICD-10-CM

## 2018-04-22 NOTE — Progress Notes (Signed)
   Subjective:  Patient presents today status post bunionectomy and hammertoe repair right. DOS: 01/31/2018.  Patient states that the pain in the range of motion is improved with physical therapy.  She continues to go to physical therapy and she has some improvement.  She is currently wearing her postoperative shoe.  Past Medical History:  Diagnosis Date  . Asthma   . Carpal tunnel syndrome, right   . Chicken pox   . Depression   . Neck pain       Objective/Physical Exam Neurovascular status intact.  Skin incisions appear to be well coapted. No sign of infectious process noted. No dehiscence.  Limited range of motion to the MTPJ's as well as significant pain and tenderness to palpation and range of motion noted however this appears to have improved since last visit.  Radiographic Exam:  Orthopedic hardware and osteotomies sites appear to be stable with routine healing.  Assessment: 1. s/p bunionectomy and hammertoe repair right. DOS: 01/31/2018   Plan of Care:  1. Patient was evaluated. X-Rays reviewed.  2.  Continue physical therapy 2 times per week 3.  Today we will allow the patient to return to work with restrictions beginning 05/03/2018 x4 weeks 4.  Today we did an injection of 0.5 cc Celestone Soluspan with 1 cc of 2% lidocaine plain injected in the first MTPJ followed by manipulation of the joint with plantarflexion and dorsiflexion.  Scar tissue adhesions were broken up today. 5.  Return to clinic in 6 weeks  Felecia Shelling, DPM [facility]  Dr. Felecia Shelling, DPM    [address] [place] [address]                                        [place], [place] [address]                Office [phone]  Fax [phone]

## 2018-05-07 ENCOUNTER — Encounter: Payer: Self-pay | Admitting: Podiatry

## 2018-05-10 ENCOUNTER — Encounter: Payer: Self-pay | Admitting: Podiatry

## 2018-05-17 ENCOUNTER — Encounter: Payer: Self-pay | Admitting: Podiatry

## 2018-05-17 ENCOUNTER — Ambulatory Visit (INDEPENDENT_AMBULATORY_CARE_PROVIDER_SITE_OTHER): Payer: Commercial Managed Care - PPO

## 2018-05-17 ENCOUNTER — Ambulatory Visit (INDEPENDENT_AMBULATORY_CARE_PROVIDER_SITE_OTHER): Payer: Commercial Managed Care - PPO | Admitting: Podiatry

## 2018-05-17 ENCOUNTER — Other Ambulatory Visit: Payer: Self-pay

## 2018-05-17 VITALS — Temp 98.7°F

## 2018-05-17 DIAGNOSIS — M76821 Posterior tibial tendinitis, right leg: Secondary | ICD-10-CM

## 2018-05-17 DIAGNOSIS — M659 Synovitis and tenosynovitis, unspecified: Secondary | ICD-10-CM | POA: Diagnosis not present

## 2018-05-17 DIAGNOSIS — Z9889 Other specified postprocedural states: Secondary | ICD-10-CM

## 2018-05-17 DIAGNOSIS — M7661 Achilles tendinitis, right leg: Secondary | ICD-10-CM

## 2018-05-17 DIAGNOSIS — M2011 Hallux valgus (acquired), right foot: Secondary | ICD-10-CM | POA: Diagnosis not present

## 2018-05-17 NOTE — Progress Notes (Signed)
   Subjective:  Patient presents today status post bunionectomy and hammertoe repair right. DOS: 01/31/2018.  Patient now has a new complaint regarding pain that has extended up to the ankle joint.  Patient has significant amount of ankle pain with ambulation.  She also says that she has a tight painful Achilles tendon.  Most importantly, while working she said that she noticed sharp stabbing burning pain to the area of the posterior tibial tendon to the right ankle.  She describes as the same pain she felt when she had to have surgery on her right shoulder for torn ligament.  She is concerned for possible rupture of her tendon.  She has noticed increased pain with tenderness and swelling on a daily basis.  She has been taking OTC ibuprofen as needed.  She also wears her immobilization cam walker after work when she gets home which does help to alleviate her symptoms  Past Medical History:  Diagnosis Date  . Asthma   . Carpal tunnel syndrome, right   . Chicken pox   . Depression   . Neck pain       Objective: Physical Exam General: The patient is alert and oriented x3 in no acute distress.  Dermatology: Skin is cool, dry and supple bilateral lower extremities. Negative for open lesions or macerations.  Skin incisions completely healed  Vascular: Palpable pedal pulses bilaterally. No edema or erythema noted at the moment. Capillary refill within normal limits.  Neurological: Epicritic and protective threshold grossly intact bilaterally.   Musculoskeletal Exam: Limited range of motion with the first MTPJ.  There is also limited range of motion of the ankle joint as well as subtalar joint.  Exquisite pain with range of motion also noted.  There is also pain on palpation to the posterior tibial tendon just distal to the medial malleolus.  Pain on palpation also noted to the Achilles tendon mid substance   Radiographic Exam:  Orthopedic hardware and osteotomies sites appear to be stable with  routine healing.  No fracture or pathology identified  Assessment: 1. s/p bunionectomy and hammertoe repair right. DOS: 01/31/2018 2.  Possible posterior tibial tendon rupture right 3.  Synovitis right ankle 4.  Achilles tendinitis right   Plan of Care:  1. Patient was evaluated. X-Rays reviewed.  2.  Continue physical therapy 2 times per week 3.  Work modified today.  Work 6 hours/day. 4 days/week.   4.  Continue OTC Motrin as needed 5.  Today written order an MRI right ankle to rule out posterior tibial tendon rupture. 6.  Today also I discussed orthotics.  I think the patient would greatly benefit from custom molded insoles.  We are going to check with the patient's insurance to see if they are covered.   7.  Return to clinic in 4 weeks  Felecia Shelling, DPM [facility]  Dr. Felecia Shelling, DPM    [address]                                        [place], [place] [address]                Office [phone]  Fax [phone]

## 2018-05-21 ENCOUNTER — Other Ambulatory Visit: Payer: Self-pay

## 2018-05-21 DIAGNOSIS — M76821 Posterior tibial tendinitis, right leg: Secondary | ICD-10-CM

## 2018-05-22 MED ORDER — PREDNISONE 2.5 MG PO TABS: 2.5000 mg | ORAL_TABLET | Freq: Every day | ORAL | 2 refills | Status: DC

## 2018-05-22 NOTE — Telephone Encounter (Signed)
This encounter was created in error - please disregard.

## 2018-05-22 NOTE — Telephone Encounter (Deleted)
Pharmacy called stating that insurance will not cover Rayos 2mg.  They requested a change to prednisone 2.5mg.  Per Dr. Hyatt's verbal order, its ok to change.  Pharmacy has been notified of change 

## 2018-05-23 ENCOUNTER — Ambulatory Visit
Admission: RE | Admit: 2018-05-23 | Discharge: 2018-05-23 | Disposition: A | Discharge status: Home / Self Care | Payer: Commercial Managed Care - PPO | Source: Ambulatory Visit | Attending: Podiatry | Admitting: Podiatry

## 2018-05-23 ENCOUNTER — Other Ambulatory Visit: Payer: Self-pay

## 2018-05-23 DIAGNOSIS — M76821 Posterior tibial tendinitis, right leg: Secondary | ICD-10-CM | POA: Diagnosis not present

## 2018-05-27 ENCOUNTER — Encounter: Payer: Self-pay | Admitting: Podiatry

## 2018-05-28 ENCOUNTER — Ambulatory Visit: Payer: Self-pay | Admitting: Podiatry

## 2018-05-29 NOTE — Telephone Encounter (Signed)
Please advise 

## 2018-05-30 NOTE — Telephone Encounter (Signed)
Yes, she can come in earlier. - Dr. Logan Bores

## 2018-05-31 ENCOUNTER — Ambulatory Visit (INDEPENDENT_AMBULATORY_CARE_PROVIDER_SITE_OTHER): Payer: Commercial Managed Care - PPO | Admitting: Podiatry

## 2018-05-31 ENCOUNTER — Other Ambulatory Visit: Payer: Self-pay

## 2018-05-31 ENCOUNTER — Encounter: Payer: Self-pay | Admitting: Podiatry

## 2018-05-31 VITALS — Temp 98.1°F | Resp 16

## 2018-05-31 DIAGNOSIS — M76821 Posterior tibial tendinitis, right leg: Secondary | ICD-10-CM

## 2018-05-31 DIAGNOSIS — M7751 Other enthesopathy of right foot: Secondary | ICD-10-CM

## 2018-05-31 DIAGNOSIS — Q688 Other specified congenital musculoskeletal deformities: Secondary | ICD-10-CM

## 2018-05-31 DIAGNOSIS — M25571 Pain in right ankle and joints of right foot: Secondary | ICD-10-CM | POA: Diagnosis not present

## 2018-05-31 NOTE — Patient Instructions (Addendum)
Dx: Os Trigonum.  Recommend Superfeet Insoles for arch support  Pre-Operative Instructions  Congratulations, you have decided to take an important step towards improving your quality of life.  You can be assured that the doctors and staff at [facility] will be with you every step of the way.  Here are some important things you should know:  1. Plan to be at the surgery center/hospital at least 1 (one) hour prior to your scheduled time, unless otherwise directed by the surgical center/hospital staff.  You must have a responsible adult accompany you, remain during the surgery and drive you home.  Make sure you have directions to the surgical center/hospital to ensure you arrive on time. 2. If you are having surgery at [facility] or [facility], you will need a copy of your medical history and physical form from your family physician within one month prior to the date of surgery. We will give you a form for your primary physician to complete.  3. We make every effort to accommodate the date you request for surgery.  However, there are times where surgery dates or times have to be moved.  We will contact you as soon as possible if a change in schedule is required.   4. No aspirin/ibuprofen for one week before surgery.  If you are on aspirin, any non-steroidal anti-inflammatory medications (Mobic, Aleve, Ibuprofen) should not be taken seven (7) days prior to your surgery.  You make take Tylenol for pain prior to surgery.  5. Medications - If you are taking daily heart and blood pressure medications, seizure, reflux, allergy, asthma, anxiety, pain or diabetes medications, make sure you notify the surgery center/hospital before the day of surgery so they can tell you which medications you should take or avoid the day of surgery. 6. No food or drink after midnight the night before surgery unless directed otherwise by surgical center/hospital staff. 7. No alcoholic beverages 24-hours prior to  surgery.  No smoking 24-hours prior or 24-hours after surgery. 8. Wear loose pants or shorts. They should be loose enough to fit over bandages, boots, and casts. 9. Don't wear slip-on shoes. Sneakers are preferred. 10. Bring your boot with you to the surgery center/hospital.  Also bring crutches or a walker if your physician has prescribed it for you.  If you do not have this equipment, it will be provided for you after surgery. 11. If you have not been contacted by the surgery center/hospital by the day before your surgery, call to confirm the date and time of your surgery. 12. Leave-time from work may vary depending on the type of surgery you have.  Appropriate arrangements should be made prior to surgery with your employer. 13. Prescriptions will be provided immediately following surgery by your doctor.  Fill these as soon as possible after surgery and take the medication as directed. Pain medications will not be refilled on weekends and must be approved by the doctor. 14. Remove nail polish on the operative foot and avoid getting pedicures prior to surgery. 15. Wash the night before surgery.  The night before surgery wash the foot and leg well with water and the antibacterial soap provided. Be sure to pay special attention to beneath the toenails and in between the toes.  Wash for at least three (3) minutes. Rinse thoroughly with water and dry well with a towel.  Perform this wash unless told not to do so by your physician.  Enclosed: 1 Ice pack (please put in  freezer the night before surgery)   1 Hibiclens skin cleaner   Pre-op instructions  If you have any questions regarding the instructions, please do not hesitate to call our office.  [place]: [address], [place], [place] [address] -- [phone]  [place]: [address] [place], [place] [address] -- [phone]  [place]: [address]  [place], [place] [address] -- [phone]  [place]: [address], [address],  [place], [place] [address] -- [phone]  Website: [email or web address]

## 2018-05-31 NOTE — Telephone Encounter (Signed)
Will contact patient to come in today

## 2018-06-04 ENCOUNTER — Telehealth: Payer: Self-pay | Admitting: Podiatry

## 2018-06-04 NOTE — Telephone Encounter (Signed)
Pt called to schedule surgery for her right foot. Stated she signed the consent forms the other day in the office.

## 2018-06-04 NOTE — Telephone Encounter (Signed)
Told pt I would look at Dr. Logan Bores' schedule with him tomorrow and then call her to get her surgery scheduled. I told her there is a chance she may not get scheduled until July but we can put her on a cancellation list as well.

## 2018-06-05 NOTE — Progress Notes (Signed)
   Subjective:  Patient presents today status post bunionectomy and hammertoe repair right. DOS: 01/31/2018.  Patient continues to have exquisite pain and tenderness with plantar flexion of the right ankle.  She says that the pain is in the posterior aspect of the ankle.  It is very painful to plantarflex her ankle or stand up on her tiptoes because it hurts so bad.  Conservative treatments have not been successful to help her.  Patient had MRI performed on 05/23/2018 which was consistent with os trigonum which may be contributing to patient's symptoms.  She presents for further treatment evaluation  Past Medical History:  Diagnosis Date  . Asthma   . Carpal tunnel syndrome, right   . Chicken pox   . Depression   . Neck pain       Objective: Physical Exam General: The patient is alert and oriented x3 in no acute distress.  Dermatology: Skin is cool, dry and supple bilateral lower extremities. Negative for open lesions or macerations.  Skin incisions completely healed  Vascular: Palpable pedal pulses bilaterally. No edema or erythema noted at the moment. Capillary refill within normal limits.  Neurological: Epicritic and protective threshold grossly intact bilaterally.   Musculoskeletal Exam: Pain with plantar flexion of the right ankle as well as pain with dorsiflexion of the right hallux consistent with os trigonum syndrome.  There is also exquisite pain with palpation to the posterior ankle.  MRI impression 05/23/2018:  Bones: Patchy, predominantly subcortical foci of increased T2 marrow signal, most prominent within the midfoot, likely reflecting osteopenia. No suspicious bone lesion. Mild naviculocuneiform and first TMT joint degenerative changes. No fracture or dislocation. Os trigonum. Os navicular.  IMPRESSION: 1. No tendon or ligamentous injury. 2. No acute osseous abnormality. Mild naviculocuneiform and first TMT joint degenerative changes. 3. Nonspecific edema involving  the flexor hallucis longus muscle.  Assessment: 1. s/p bunionectomy and hammertoe repair right. DOS: 01/31/2018 2.  Os trigonum syndrome right  Plan of Care:  1. Patient was evaluated.  MRI reviewed.  2.  After long discussion with the patient today we did talk about surgical excision of the os trigonum.  Patient is very confident that removal of this ossicle will alleviate her posterior ankle symptoms.  Clinically I do believe that she is experiencing a significant amount of pain from this ossicle based on clinical exam.  All possible complications and details the procedure were explained.  No guarantees were expressed or implied.  All patient questions answered. 3.  Authorization for surgery initiated today.  Surgery will consist of excision of os trigonum right posterior ankle 4.  Return to clinic 1 week postop  Felecia Shelling, DPM [facility]  Dr. Felecia Shelling, DPM    [address]                                        [place], [place] [address]                Office [phone]  Fax [phone]

## 2018-06-05 NOTE — Telephone Encounter (Signed)
Called pt to get her scheduled for surgery. I offered pt date of 05 June but pt has a follow up with Dr. Logan Bores. Requested the next available Friday which is 19 June. I am scheduling pt for that day for surgery at 2:30 or later per Dr. Logan Bores okay.

## 2018-06-07 ENCOUNTER — Ambulatory Visit: Payer: Commercial Managed Care - PPO | Admitting: Podiatry

## 2018-06-10 ENCOUNTER — Telehealth: Payer: Self-pay | Admitting: *Deleted

## 2018-06-10 NOTE — Telephone Encounter (Signed)
"  I just want to make sure I'm still scheduled for surgery on the sixteenth."  Yes, you are still scheduled for June 28, 2018.  "Can you tell me what time I'll need to be there?"  No, someone from the surgical center will call you a day or two prior to your surgery date.  They will give you your arrival time during that call.

## 2018-06-14 ENCOUNTER — Ambulatory Visit (INDEPENDENT_AMBULATORY_CARE_PROVIDER_SITE_OTHER): Payer: Commercial Managed Care - PPO | Admitting: Podiatry

## 2018-06-14 ENCOUNTER — Telehealth: Payer: Self-pay | Admitting: *Deleted

## 2018-06-14 ENCOUNTER — Other Ambulatory Visit: Payer: Self-pay

## 2018-06-14 ENCOUNTER — Encounter: Payer: Self-pay | Admitting: Podiatry

## 2018-06-14 ENCOUNTER — Ambulatory Visit: Payer: Commercial Managed Care - PPO | Admitting: Podiatry

## 2018-06-14 VITALS — Temp 98.0°F | Resp 16

## 2018-06-14 DIAGNOSIS — M25571 Pain in right ankle and joints of right foot: Secondary | ICD-10-CM

## 2018-06-14 DIAGNOSIS — Q688 Other specified congenital musculoskeletal deformities: Secondary | ICD-10-CM

## 2018-06-14 DIAGNOSIS — M7751 Other enthesopathy of right foot: Secondary | ICD-10-CM | POA: Diagnosis not present

## 2018-06-14 DIAGNOSIS — M76821 Posterior tibial tendinitis, right leg: Secondary | ICD-10-CM

## 2018-06-14 NOTE — Telephone Encounter (Signed)
DOS 06/28/2018, CPT CODE: 61950 - TARSAL EXOSTECTOMY RIGHT FOOT  UMR: Effective Date -10/09/2017   Benefit percentage  70% Plan pays  30% You pay  Individual deductible  $0.00 to go     $1,000.00 out of $1,000.00         Individual integrated out-of-pocket  $0.00 to go     $5,000.00 out of $5,000.00  *Applied amount includes $13.85 for prescriptions   PRE-CERT IS NOT REQUIRED:ref.# [id number]

## 2018-06-16 NOTE — Progress Notes (Signed)
   Subjective:  Patient presents today status post bunionectomy and hammertoe repair right. DOS: 01/31/2018. He states his pain is unchanged. He has been using the CAM boot as directed but states it makes it difficult to walk. He has been icing the foot and taking Ibuprofen for treatment with minimal relief. There are no modifying factors noted. Patient is here for further evaluation and treatment.   Past Medical History:  Diagnosis Date  . Asthma   . Carpal tunnel syndrome, right   . Chicken pox   . Depression   . Neck pain       Objective: Physical Exam General: The patient is alert and oriented x3 in no acute distress.  Dermatology: Skin is cool, dry and supple bilateral lower extremities. Negative for open lesions or macerations.  Skin incisions completely healed  Vascular: Palpable pedal pulses bilaterally. No edema or erythema noted at the moment. Capillary refill within normal limits.  Neurological: Epicritic and protective threshold grossly intact bilaterally.   Musculoskeletal Exam: Pain with plantar flexion of the right ankle as well as pain with dorsiflexion of the right hallux consistent with os trigonum syndrome.  There is also exquisite pain with palpation to the posterior ankle.  Assessment: 1. s/p bunionectomy and hammertoe repair right. DOS: 01/31/2018 2. Os trigonum syndrome right  Plan of Care:  1. Patient was evaluated.   2. No changes this visit. Surgery scheduled for 06/28/2018.  3. Continue weightbearing in CAM boot.  4. Return to clinic one week post op.   Edrick Kins, DPM [facility]  Dr. Edrick Kins, [place] [address]                                        [place], [place] [address]                Office [phone]  Fax [phone]

## 2018-06-26 ENCOUNTER — Telehealth: Payer: Self-pay | Admitting: *Deleted

## 2018-06-26 NOTE — Telephone Encounter (Signed)
I am calling to let you know that unfortunately Dr. Amalia Hailey will not be able to do your surgery this Friday.  He had an emergency come up.  "I just received a call today confirming my surgery, I mean literally about three hours ago."  That was someone from the surgical center.  I'm calling from Dr. Rebekah Chesterfield office.  He cannot do your surgery on Friday.  I'm so sorry.  "I had made arrangements already.  I had taken time off from work.  My fiancee had taken time off from work to bring me.  When can he do it?"  He can do it on Thursday of next week, 07/04/2018.  "What time will it be?"  I can't give you a time it may be sometime that afternoon.  Someone from the surgical center will call you and give you your arrival time. "They'll call me next week?"  Yes, they will call you next week.  I rescheduled her surgery from 06/28/2018 to 07/04/2018.

## 2018-07-04 ENCOUNTER — Other Ambulatory Visit: Payer: Self-pay | Admitting: Podiatry

## 2018-07-04 DIAGNOSIS — M25771 Osteophyte, right ankle: Secondary | ICD-10-CM

## 2018-07-04 MED ORDER — HYDROCODONE-ACETAMINOPHEN 5-325 MG PO TABS: 1.0000 | ORAL_TABLET | Freq: Four times a day (QID) | ORAL | 0 refills | Status: DC | PRN

## 2018-07-04 NOTE — Progress Notes (Signed)
.  postop

## 2018-07-05 ENCOUNTER — Other Ambulatory Visit: Payer: Commercial Managed Care - PPO

## 2018-07-09 ENCOUNTER — Ambulatory Visit (INDEPENDENT_AMBULATORY_CARE_PROVIDER_SITE_OTHER): Payer: Commercial Managed Care - PPO | Admitting: Podiatry

## 2018-07-09 ENCOUNTER — Ambulatory Visit (INDEPENDENT_AMBULATORY_CARE_PROVIDER_SITE_OTHER): Payer: Commercial Managed Care - PPO

## 2018-07-09 ENCOUNTER — Other Ambulatory Visit: Payer: Self-pay

## 2018-07-09 VITALS — Temp 97.8°F

## 2018-07-09 DIAGNOSIS — Z9889 Other specified postprocedural states: Secondary | ICD-10-CM

## 2018-07-09 DIAGNOSIS — M659 Synovitis and tenosynovitis, unspecified: Secondary | ICD-10-CM | POA: Diagnosis not present

## 2018-07-09 DIAGNOSIS — M65971 Unspecified synovitis and tenosynovitis, right ankle and foot: Secondary | ICD-10-CM

## 2018-07-09 DIAGNOSIS — Q688 Other specified congenital musculoskeletal deformities: Secondary | ICD-10-CM

## 2018-07-10 NOTE — Progress Notes (Signed)
   Subjective:  Patient presents today status post bunionectomy and hammertoe repair right. DOS: 01/31/2018 and os trigonum resection DOS 07/04/2018. She reports some moderate soreness that is exacerbated by applying pressure to the foot. She states the pain is worse in the morning time. She has been using the CAM boot as directed. Patient is here for further evaluation and treatment.   Past Medical History:  Diagnosis Date  . Asthma   . Carpal tunnel syndrome, right   . Chicken pox   . Depression   . Neck pain      Objective/Physical Exam Neurovascular status intact.  Skin incisions appear to be well coapted with sutures and staples intact. No sign of infectious process noted. No dehiscence. No active bleeding noted. Moderate edema noted to the surgical extremity.  Radiographic Exam:  Osteotomies sites appear to be stable with routine healing.  Assessment: 1. s/p bunionectomy and hammertoe repair right. DOS: 01/31/2018 2. S/p os trigonum resection right DOS: 07/04/2018  Plan of Care:  1. Patient was evaluated. X-Rays reviewed.  2. Dressing changed. Keep clean, dry and intact for one week.  3. Continue weightbearing in CAM boot.  4. Return to clinic in one week for staple removal.    Edrick Kins, DPM [facility]  Dr. Edrick Kins, [address]                                        [place], [place] [address]                Office [phone]  Fax [phone]

## 2018-07-15 ENCOUNTER — Encounter: Payer: Self-pay | Admitting: Podiatry

## 2018-07-19 ENCOUNTER — Other Ambulatory Visit: Payer: Self-pay

## 2018-07-19 ENCOUNTER — Encounter: Payer: Self-pay | Admitting: Podiatry

## 2018-07-19 ENCOUNTER — Ambulatory Visit (INDEPENDENT_AMBULATORY_CARE_PROVIDER_SITE_OTHER): Payer: Commercial Managed Care - PPO | Admitting: Podiatry

## 2018-07-19 VITALS — Temp 98.7°F

## 2018-07-19 DIAGNOSIS — Z9889 Other specified postprocedural states: Secondary | ICD-10-CM

## 2018-07-19 DIAGNOSIS — M659 Synovitis and tenosynovitis, unspecified: Secondary | ICD-10-CM

## 2018-07-19 DIAGNOSIS — Q688 Other specified congenital musculoskeletal deformities: Secondary | ICD-10-CM

## 2018-07-22 NOTE — Progress Notes (Signed)
   Subjective:  Patient presents today status post bunionectomy and hammertoe repair right. DOS: 01/31/2018 and os trigonum resection DOS 07/04/2018. She states she is doing well and improving. She reports only minimal pain with some associated swelling. She has been using the CAM boot as directed. There are no modifying factors noted. Patient is here for further evaluation and treatment.   Past Medical History:  Diagnosis Date  . Asthma   . Carpal tunnel syndrome, right   . Chicken pox   . Depression   . Neck pain      Objective/Physical Exam Neurovascular status intact.  Skin incisions appear to be well coapted. No sign of infectious process noted. No dehiscence. No active bleeding noted. Moderate edema noted to the surgical extremity.  Assessment: 1. s/p bunionectomy and hammertoe repair right. DOS: 01/31/2018 2. S/p os trigonum resection right DOS: 07/04/2018  Plan of Care:  1. Patient was evaluated. 2. Orders for physical therapy three times weekly for four weeks.  3. Staples removed.  4. Transition out of CAM boot into sneakers with Powerstep insoles.  5. Return to clinic in 4 weeks.   Edrick Kins, DPM [facility]  Dr. Edrick Kins, [place]                                        [place], [place] [address]                Office [phone]  Fax [phone]

## 2018-08-13 ENCOUNTER — Other Ambulatory Visit: Payer: Self-pay

## 2018-08-13 ENCOUNTER — Ambulatory Visit (INDEPENDENT_AMBULATORY_CARE_PROVIDER_SITE_OTHER): Payer: Commercial Managed Care - PPO | Admitting: Podiatry

## 2018-08-13 ENCOUNTER — Encounter: Payer: Self-pay | Admitting: Podiatry

## 2018-08-13 DIAGNOSIS — Q688 Other specified congenital musculoskeletal deformities: Secondary | ICD-10-CM

## 2018-08-13 DIAGNOSIS — Z9889 Other specified postprocedural states: Secondary | ICD-10-CM

## 2018-08-13 DIAGNOSIS — M659 Synovitis and tenosynovitis, unspecified: Secondary | ICD-10-CM

## 2018-08-15 NOTE — Progress Notes (Signed)
   Subjective:  Patient presents today status post bunionectomy and hammertoe repair right. DOS: 01/31/2018 and os trigonum resection DOS 07/04/2018. She states she is doing well and improving. She reports some continued swelling in the posterior and medial foot. She does reports some continued arch pain that causes her to walk with a limp. She has gone to physical therapy but states she cannot afford to continue. Walking increases her symptoms. Patient is here for further evaluation and treatment.   Past Medical History:  Diagnosis Date  . Asthma   . Carpal tunnel syndrome, right   . Chicken pox   . Depression   . Neck pain      Objective/Physical Exam Neurovascular status intact.  Skin incisions appear to be well coapted. No sign of infectious process noted. No dehiscence. No active bleeding noted. Moderate edema noted to the surgical extremity. Pain with palpation noted to the right foot.   Assessment: 1. s/p bunionectomy and hammertoe repair right. DOS: 01/31/2018 2. S/p os trigonum resection right DOS: 07/04/2018 3. Capsulitis right foot  Plan of Care:  1. Patient was evaluated. 2. Continue physical therapy exercises at home daily.  3. Continue wearing good shoe gear with Powerstep insoles.  4. Return to work with restrictions on 08/29/2018. Note provided.  5. Return to clinic in 6 weeks.   Edrick Kins, DPM [facility]  Dr. Edrick Kins, [address]                                        [place], [place] [address]                Office [phone]  Fax [phone]

## 2018-08-21 ENCOUNTER — Telehealth: Payer: Self-pay | Admitting: Podiatry

## 2018-08-21 ENCOUNTER — Encounter: Payer: Self-pay | Admitting: Podiatry

## 2018-08-21 NOTE — Telephone Encounter (Signed)
Christella Scheuermann called about status of medical records request sent on 04 August. Fax to [phone] reference ID# [id number].

## 2018-09-04 ENCOUNTER — Telehealth: Payer: Self-pay | Admitting: Podiatry

## 2018-09-04 NOTE — Telephone Encounter (Signed)
Calling to check status of medical records request. Request was sent on 08/28/2018 for records from 05/18/2018 - Present. Please fax to [phone] or give [place] a call back at [phone] reference ID# [id number].

## 2018-09-24 ENCOUNTER — Encounter: Payer: Self-pay | Admitting: Podiatry

## 2018-09-24 ENCOUNTER — Other Ambulatory Visit: Payer: Self-pay

## 2018-09-24 ENCOUNTER — Ambulatory Visit (INDEPENDENT_AMBULATORY_CARE_PROVIDER_SITE_OTHER): Payer: Commercial Managed Care - PPO | Admitting: Podiatry

## 2018-09-24 DIAGNOSIS — M76821 Posterior tibial tendinitis, right leg: Secondary | ICD-10-CM | POA: Diagnosis not present

## 2018-09-24 NOTE — Progress Notes (Signed)
   Subjective:  Patient presents today status post bunionectomy and hammertoe repair right. DOS: 01/31/2018 and os trigonum resection DOS 07/04/2018.  Patient states that the surgeries are doing well and improving significantly.  Physical therapy did help and now she is doing modalities at home to help alleviate symptoms.  Patient states that ever since physical therapy she is beginning to have pain and tenderness to the medial aspect of the surgical foot.  The medial aspect of her foot is very tender to palpation despite loose shoe gear and anti-inflammatories.  She presents for further treatment and evaluation   Past Medical History:  Diagnosis Date  . Asthma   . Carpal tunnel syndrome, right   . Chicken pox   . Depression   . Neck pain      Objective/Physical Exam Physical Exam General: The patient is alert and oriented x3 in no acute distress.  Dermatology: Skin is cool, dry and supple bilateral lower extremities. Negative for open lesions or macerations.  Vascular: Palpable pedal pulses bilaterally. No edema or erythema noted. Capillary refill within normal limits.  Neurological: Epicritic and protective threshold grossly intact bilaterally.   Musculoskeletal Exam: All pedal and ankle joints range of motion within normal limits bilateral. Muscle strength 5/5 in all groups bilateral.  There is some very significant tenderness to palpation at the insertion of the posterior tibial tendon along the navicular tuberosity.  Pain also noted with inversion of the foot in relationship to the leg and firing of the posterior tibial tendon.  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  Further evaluation of this area does indicate a hypertrophic navicular tuberosity.  Assessment: 1. s/p bunionectomy and hammertoe repair right. DOS: 01/31/2018 2. S/p os trigonum resection right DOS: 07/04/2018 3.  Insertional posterior tibial tendinitis with  enlarged navicular tuberosity right 4. Os tibiale externum possibly contributing to the patient's symptoms in this area  Plan of Care:  1. Patient was evaluated. 2.  Continue work restrictions no more than 5-6 hours/day 3.  The patient states that she will begin looking for a different job is not so strenuous on her feet 4.  Patient declined anti-inflammatory injections today in this area 5.  Recommend that the patient resume wearing the immobilization cam boot to see if it helps to alleviate symptoms to this area 6.  If the patient's not better after 1 month we may need to consider possibly removing the accessory ossicle.  I would recommend MRI focusing on this area prior to surgical intervention 7.  Return to clinic in 4 weeks  Edrick Kins, DPM [facility]  Dr. Edrick Kins, [address]                                        [place], [place] [address]                Office [phone]  Fax [phone]

## 2018-10-23 DIAGNOSIS — M79642 Pain in left hand: Secondary | ICD-10-CM | POA: Insufficient documentation

## 2018-10-23 DIAGNOSIS — R7689 Other specified abnormal immunological findings in serum: Secondary | ICD-10-CM | POA: Insufficient documentation

## 2018-10-23 DIAGNOSIS — R768 Other specified abnormal immunological findings in serum: Secondary | ICD-10-CM | POA: Insufficient documentation

## 2018-10-23 DIAGNOSIS — M79641 Pain in right hand: Secondary | ICD-10-CM | POA: Insufficient documentation

## 2018-10-23 DIAGNOSIS — M67432 Ganglion, left wrist: Secondary | ICD-10-CM | POA: Insufficient documentation

## 2018-10-25 ENCOUNTER — Ambulatory Visit (INDEPENDENT_AMBULATORY_CARE_PROVIDER_SITE_OTHER): Payer: Commercial Managed Care - PPO | Admitting: Podiatry

## 2018-10-25 ENCOUNTER — Encounter: Payer: Self-pay | Admitting: Podiatry

## 2018-10-25 ENCOUNTER — Other Ambulatory Visit: Payer: Self-pay

## 2018-10-25 DIAGNOSIS — M76821 Posterior tibial tendinitis, right leg: Secondary | ICD-10-CM

## 2018-10-29 NOTE — Progress Notes (Signed)
   Subjective:  38 year old female presenting today for follow up evaluation of right foot pain. She reports shooting pain in the arch of the foot. She has been using the CAM boot as directed. Being on the foot increases the pain. She denies alleviating factors. Patient is here for further evaluation and treatment.   Past Medical History:  Diagnosis Date  . Asthma   . Carpal tunnel syndrome, right   . Chicken pox   . Depression   . Neck pain      Objective/Physical Exam Physical Exam General: The patient is alert and oriented x3 in no acute distress.  Dermatology: Skin is cool, dry and supple bilateral lower extremities. Negative for open lesions or macerations.  Vascular: Palpable pedal pulses bilaterally. No edema or erythema noted. Capillary refill within normal limits.  Neurological: Epicritic and protective threshold grossly intact bilaterally.   Musculoskeletal Exam: All pedal and ankle joints range of motion within normal limits bilateral. Muscle strength 5/5 in all groups bilateral.  There is some very significant tenderness to palpation at the insertion of the posterior tibial tendon along the navicular tuberosity.  Pain also noted with inversion of the foot in relationship to the leg and firing of the posterior tibial tendon.  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  Further evaluation of this area does indicate a hypertrophic navicular tuberosity.  Assessment: 1. s/p bunionectomy and hammertoe repair right. DOS: 01/31/2018 - resolved  2. S/p os trigonum resection right DOS: 07/04/2018 - resolved  3. Insertional posterior tibial tendinitis with enlarged navicular tuberosity right 4. Os tibiale externum possibly contributing to the patient's symptoms in this area  Plan of Care:  1. Patient was evaluated. 2. Today we discussed the conservative versus surgical management of the presenting pathology. The patient opts  for surgical management. All possible complications and details of the procedure were explained. All patient questions were answered. No guarantees were expressed or implied. 3. Authorization for surgery was initiated today. Surgery will consist of navicular exostectomy right; repair posterior tibial tendon right.  4. Return to clinic one week post op.     Edrick Kins, DPM [facility]  Dr. Edrick Kins, [address]                                        [place], [place] [address]                Office [phone]  Fax [phone]

## 2018-10-30 ENCOUNTER — Ambulatory Visit (INDEPENDENT_AMBULATORY_CARE_PROVIDER_SITE_OTHER): Payer: Commercial Managed Care - PPO | Admitting: Family Medicine

## 2018-10-30 ENCOUNTER — Encounter: Payer: Self-pay | Admitting: Family Medicine

## 2018-10-30 ENCOUNTER — Telehealth: Payer: Self-pay | Admitting: *Deleted

## 2018-10-30 ENCOUNTER — Other Ambulatory Visit: Payer: Self-pay

## 2018-10-30 VITALS — Ht 62.0 in | Wt 138.0 lb

## 2018-10-30 DIAGNOSIS — J302 Other seasonal allergic rhinitis: Secondary | ICD-10-CM

## 2018-10-30 DIAGNOSIS — B001 Herpesviral vesicular dermatitis: Secondary | ICD-10-CM

## 2018-10-30 DIAGNOSIS — E739 Lactose intolerance, unspecified: Secondary | ICD-10-CM

## 2018-10-30 MED ORDER — ALBUTEROL SULFATE HFA 108 (90 BASE) MCG/ACT IN AERS: 2.0000 | INHALATION_SPRAY | Freq: Four times a day (QID) | RESPIRATORY_TRACT | 3 refills | Status: DC | PRN

## 2018-10-30 MED ORDER — VALACYCLOVIR HCL 1 G PO TABS: 1000.0000 mg | ORAL_TABLET | Freq: Every day | ORAL | 3 refills | Status: DC

## 2018-10-30 NOTE — Progress Notes (Signed)
Patient ID: Stephanie Lyons, female   DOB: 10-Dec-1980, 38 y.o.   MRN: [id number]    Virtual Visit via video Note  This visit type was conducted due to national recommendations for restrictions regarding the COVID-19 pandemic (e.g. social distancing).  This format is felt to be most appropriate for this patient at this time.  All issues noted in this document were discussed and addressed.  No physical exam was performed (except for noted visual exam findings with Video Visits).   I connected with Lysbeth Penner today at 10:20 AM EDT by a video enabled telemedicine application and verified that I am speaking with the correct person using two identifiers. Location patient: home Location provider: work or home office Persons participating in the virtual visit: patient, provider  I discussed the limitations, risks, security and privacy concerns of performing an evaluation and management service by video and the availability of in person appointments. I also discussed with the patient that there may be a patient responsible charge related to this service. The patient expressed understanding and agreed to proceed.   HPI:  Patient and I connected via video to discuss refill on Valtrex for breaks.  Patient states she gets them fairly frequently and did best when she was on a daily preventative dose rather than treating as needed.  States sometimes her outbreaks can be quite severe even causing lip swelling.  She prefer to be on preventative daily dosing rather than as needed treatment.  Also would like refill of albuterol inhaler.  Sometimes gets a little wheezy at times during the fall season related to allergies, does not currently have any cough, shortness of breath, wheezing, chest pain or any other sick symptoms.  Would like to have an inhaler on hand if needed.  Wondering if she possibly has a food intolerance.  States she has noticed when she eats cheese or dairy her stomach begins to hurt.   Over this past year patient has worked hard to lose 35 pounds with watching her calories and avoiding excess sugars and excess carbs.  Doing this diet plan, she has been able to hone in on what type of foods seem to bother her stomach and it really seems to be anytime she eats cheese, yogurts or drinks milk.    ROS: See pertinent positives and negatives per HPI.  Past Medical History:  Diagnosis Date  . Asthma   . Carpal tunnel syndrome, right   . Chicken pox   . Depression   . Neck pain     Past Surgical History:  Procedure Laterality Date  . CARPAL TUNNEL RELEASE Right 04/02/2015   Procedure: OPEN CARPAL TUNNEL RELEASE;  Surgeon: Leanor Kail, MD;  Location: [place];  Service: Orthopedics;  Laterality: Right;  . FOOT SURGERY Right   . HAND SURGERY     R- hand 2019, L-hand (2x) 2019  . left foot surgery    . SHOULDER SURGERY    . TONSILLECTOMY      Family History  Problem Relation Age of Onset  . Hypertension Mother   . Hypertension Maternal Grandmother   . Hypertension Maternal Grandfather    Social History   Tobacco Use  . Smoking status: Current Every Day Smoker    Packs/day: 0.30    Years: 5.00    Pack years: 1.50    Types: Cigarettes  . Smokeless tobacco: Never Used  Substance Use Topics  . Alcohol use: Yes    Alcohol/week: 8.0 standard drinks    Types:  4 Glasses of wine, 4 Cans of beer per week    Comment: social      Current Outpatient Medications:  .  albuterol (PROVENTIL HFA;VENTOLIN HFA) 108 (90 Base) MCG/ACT inhaler, Inhale 2 puffs into the lungs every 6 (six) hours as needed for wheezing or shortness of breath., Disp: 1 Inhaler, Rfl: 0 .  diclofenac sodium (VOLTAREN) 1 % GEL, , Disp: , Rfl:  .  gabapentin (NEURONTIN) 300 MG capsule, Take 1 capsule (300 mg total) by mouth 2 (two) times daily., Disp: 60 capsule, Rfl: 2 .  HYDROcodone-acetaminophen (NORCO/VICODIN) 5-325 MG tablet, Take 1 tablet by mouth every 6 (six) hours as needed for  moderate pain., Disp: 30 tablet, Rfl: 0  EXAM:  GENERAL: alert, oriented, appears well and in no acute distress  HEENT: atraumatic, conjunttiva clear, no obvious abnormalities on inspection of external nose and ears  NECK: normal movements of the head and neck  LUNGS: on inspection no signs of respiratory distress, breathing rate appears normal, no obvious gross SOB, gasping or wheezing  SKIN: No active herpes lesion seen on mouth at this time.  CV: no obvious cyanosis  MS: moves all visible extremities without noticeable abnormality  PSYCH/NEURO: pleasant and cooperative, no obvious depression or anxiety, speech and thought processing grossly intact  ASSESSMENT AND PLAN:  Discussed the following assessment and plan:  Seasonal allergic reaction - Plan: albuterol (VENTOLIN HFA) 108 (90 Base) MCG/ACT inhaler  Recurrent cold sores - Plan: valACYclovir (VALTREX) 1000 MG tablet  Dietary lactose intolerance  Patient will use inhaler if needed for any seasonal allergy breathing symptoms that can occur during this time year.  She will begin Valtrex daily to prevent cold sore outbreaks.   I discussed the assessment and treatment plan with the patient. The patient was provided an opportunity to ask questions and all were answered. The patient agreed with the plan and demonstrated an understanding of the instructions.   The patient was advised to call back or seek an in-person evaluation if the symptoms worsen or if the condition fails to improve as anticipated.  Long discussion regards to strategies to help lactose intolerance.  Advised she can try dairy alternatives like almond milk or milk, also there are almond milk or oatmeal premade yogurts and nondairy cheeses.  Advised that if she does eat a dairy product, she can prep herself by taking an over-the-counter Lactaid before ingesting the dairy item.  Patient aware she can call clinic anytime with questions or concerns, otherwise she  will follow up regularly about every 6 months for general checkups.  Tracey Harries, FNP

## 2018-10-30 NOTE — Telephone Encounter (Signed)
"  I'm calling to schedule my surgery with Dr. Amalia Hailey."  Do you have a date that you like?  My apologies for not calling you back yesterday.  "No worries, I'm not in a hurry.  I would like to do it in November if possible."  He can do it on November 28, 2018.  "November 19 is perfect.  Thank you so much."  You can go online and register now, the instructions on how to do that are in the brochure that we gave you.  You will receive a call from someone from the surgical center a day or two prior to your surgery date and they will give you your arrival time.

## 2018-11-06 ENCOUNTER — Telehealth: Payer: Self-pay | Admitting: Podiatry

## 2018-11-06 NOTE — Telephone Encounter (Signed)
[employer] called to check on status of medical records request. Can be faxed to [phone]. When calling reference claim# [id number].

## 2018-11-08 DIAGNOSIS — R200 Anesthesia of skin: Secondary | ICD-10-CM | POA: Insufficient documentation

## 2018-11-21 ENCOUNTER — Telehealth: Payer: Self-pay | Admitting: *Deleted

## 2018-11-21 NOTE — Telephone Encounter (Signed)
"  I have surgery coming up and I wasn't given a time.  My mom is going to be bringing me.  She wanted to know a time so she asked me to call you."  I can't give you a time.  You can call the surgical center scheduler and see if she can give you the time.  They normally call patients a day or two prior to their surgery date and give the times.  They don't usually give times out sooner because they may have cancellations or they may have Diabetic patients or children that they like to do first.  "Okay, I can wait until they call me a day or two before my date."

## 2018-11-27 ENCOUNTER — Telehealth: Payer: Self-pay | Admitting: *Deleted

## 2018-11-27 NOTE — Telephone Encounter (Signed)
DOS 11/28/2018 REPAIR POSTERIOR TIBIAL TENDON - 27659 AND TARSAL EXOSTECTOMY - 28104 RIGHT FOOT  [facility]: Eligibility Date - 07/10/2018  Benefit percentage  70%Plan pays  30%You pay  Individual deductible $930.00 to go  $70.00 out of $1,000.00    Individual integrated out-of-pocket  $4,700.00 to go  $300.00 out of $5,000.00  Prior Authorization Requirements: Per [facility], pre-certification is NOT REQUIRED.  Ref.# [id number]

## 2018-11-28 ENCOUNTER — Other Ambulatory Visit: Payer: Self-pay | Admitting: Podiatry

## 2018-11-28 DIAGNOSIS — M76821 Posterior tibial tendinitis, right leg: Secondary | ICD-10-CM

## 2018-11-28 DIAGNOSIS — M25774 Osteophyte, right foot: Secondary | ICD-10-CM

## 2018-11-28 MED ORDER — HYDROCODONE-ACETAMINOPHEN 10-325 MG PO TABS
1.0000 | ORAL_TABLET | ORAL | 0 refills | Status: AC | PRN
Start: 2018-11-28 — End: 2018-12-03

## 2018-11-28 NOTE — Progress Notes (Signed)
PRN postop 

## 2018-12-03 ENCOUNTER — Encounter: Payer: Self-pay | Admitting: Podiatry

## 2018-12-03 ENCOUNTER — Ambulatory Visit (INDEPENDENT_AMBULATORY_CARE_PROVIDER_SITE_OTHER): Payer: Commercial Managed Care - PPO

## 2018-12-03 ENCOUNTER — Ambulatory Visit (INDEPENDENT_AMBULATORY_CARE_PROVIDER_SITE_OTHER): Payer: Commercial Managed Care - PPO | Admitting: Podiatry

## 2018-12-03 ENCOUNTER — Other Ambulatory Visit: Payer: Self-pay

## 2018-12-03 VITALS — BP 133/86 | HR 94 | Temp 98.5°F

## 2018-12-03 DIAGNOSIS — M76821 Posterior tibial tendinitis, right leg: Secondary | ICD-10-CM

## 2018-12-03 DIAGNOSIS — M2011 Hallux valgus (acquired), right foot: Secondary | ICD-10-CM | POA: Diagnosis not present

## 2018-12-03 DIAGNOSIS — Z9889 Other specified postprocedural states: Secondary | ICD-10-CM

## 2018-12-03 NOTE — Progress Notes (Signed)
  Subjective:  Patient ID: Stephanie Lyons, female    DOB: [birth year],  MRN: [id number]  Chief Complaint  Patient presents with  . Routine Post Op    POV#1 DOS 11/28/2018 REPAIR POST TIBIAL TENDON AND TARSAL EXOSTECTOMY RT - DR. Rebekah Chesterfield PT.    DOS: 11/28/2018 Procedure: Status post posterior tibial tendon repair with tarsal exostectomy right foot  38 y.o. female returns for post-op check.  Patient is doing well.  She states that there is some pain occasionally at night.  Patient has been taking her pain medication as directed.  She denies any other acute complaints.  She denies any clinical signs of infection.  Review of Systems: Negative except as noted in the HPI. Denies N/V/F/Ch.  Past Medical History:  Diagnosis Date  . Asthma   . Carpal tunnel syndrome, right   . Chicken pox   . Depression   . Neck pain     Current Outpatient Medications:  .  albuterol (VENTOLIN HFA) 108 (90 Base) MCG/ACT inhaler, Inhale 2 puffs into the lungs every 6 (six) hours as needed for wheezing or shortness of breath., Disp: 18 g, Rfl: 3 .  diclofenac sodium (VOLTAREN) 1 % GEL, , Disp: , Rfl:  .  gabapentin (NEURONTIN) 300 MG capsule, Take 1 capsule (300 mg total) by mouth 2 (two) times daily., Disp: 60 capsule, Rfl: 2 .  HYDROcodone-acetaminophen (NORCO) 10-325 MG tablet, Take 1 tablet by mouth every 4 (four) hours as needed for up to 5 days., Disp: 30 tablet, Rfl: 0 .  valACYclovir (VALTREX) 1000 MG tablet, Take 1 tablet (1,000 mg total) by mouth daily., Disp: 90 tablet, Rfl: 3  Social History   Tobacco Use  Smoking Status Current Every Day Smoker  . Packs/day: 0.30  . Years: 5.00  . Pack years: 1.50  . Types: Cigarettes  Smokeless Tobacco Never Used    Allergies  Allergen Reactions  . Dust Mite Extract   . Kiwi Extract   . Morphine And Related Other (See Comments)    Hypotension  . Percocet [Oxycodone-Acetaminophen] Nausea And Vomiting  . Prednisone Other (See Comments)   Objective:    Vitals:   12/03/18 1320  BP: 133/86  Pulse: 94  Temp: 98.5 F (36.9 C)   There is no height or weight on file to calculate BMI. Constitutional Well developed. Well nourished.  Vascular Foot warm and well perfused. Capillary refill normal to all digits.   Neurologic Normal speech. Oriented to person, place, and time. Epicritic sensation to light touch grossly present bilaterally.  Dermatologic Skin healing well without signs of infection. Skin edges well coapted without signs of infection.  Orthopedic: Tenderness to palpation noted about the surgical site.   Radiographs: 2 views of skeletally mature adult: Status post surgical excision of the navicular exostectomy.  All the hardware intact without any signs of loosening noted.  No other bony deformities noted. Assessment:   1. S/P foot surgery, right   2. Status post surgery   3. Posterior tibial tendinitis, right    Plan:  Patient was evaluated and treated and all questions answered.  S/p foot surgery right -Progressing as expected post-operatively. -XR: See above -WB Status: Nonweightbearing in cam boot with crutches -Sutures: Staples are intact.  No signs of dehiscence noted.  No clinical signs of infection noted. -Medications: None -Foot redressed.  No follow-ups on file.

## 2018-12-12 ENCOUNTER — Encounter: Payer: Self-pay | Admitting: Podiatry

## 2018-12-13 ENCOUNTER — Ambulatory Visit (INDEPENDENT_AMBULATORY_CARE_PROVIDER_SITE_OTHER): Payer: Commercial Managed Care - PPO | Admitting: Podiatry

## 2018-12-13 ENCOUNTER — Encounter: Payer: Self-pay | Admitting: Podiatry

## 2018-12-13 ENCOUNTER — Other Ambulatory Visit: Payer: Self-pay

## 2018-12-13 DIAGNOSIS — M76821 Posterior tibial tendinitis, right leg: Secondary | ICD-10-CM

## 2018-12-13 DIAGNOSIS — Z9889 Other specified postprocedural states: Secondary | ICD-10-CM

## 2018-12-17 NOTE — Progress Notes (Signed)
   Subjective:  Patient presents today status post posterior tibial tendon repair with navicular exostectomy right. DOS: 11/28/2018. She states she is doing well. She reports a decrease in the soreness. She denies any aggravating factors. She has been nonweightbearing in CAM boot as directed. Patient is here for further evaluation and treatment.   Past Medical History:  Diagnosis Date  . Asthma   . Carpal tunnel syndrome, right   . Chicken pox   . Depression   . Neck pain       Objective/Physical Exam Neurovascular status intact.  Skin incisions appear to be well coapted with sutures and staples intact. No sign of infectious process noted. No dehiscence. No active bleeding noted. Moderate edema noted to the surgical extremity.  Assessment: 1. s/p posterior tibial tendon repair with navicular exostectomy right. DOS: 11/28/2018   Plan of Care:  1. Patient was evaluated. 2. Staples removed.  3. Continue nonweightbearing in CAM boot.  4. Return to clinic in 2 weeks to initiate home physical therapy.    Edrick Kins, DPM [facility]  Dr. Edrick Kins, [address]                                        [place], [place] [address]                Office [phone]  Fax [phone]

## 2018-12-27 ENCOUNTER — Ambulatory Visit (INDEPENDENT_AMBULATORY_CARE_PROVIDER_SITE_OTHER): Payer: Commercial Managed Care - PPO | Admitting: Podiatry

## 2018-12-27 ENCOUNTER — Other Ambulatory Visit: Payer: Self-pay

## 2018-12-27 DIAGNOSIS — Z9889 Other specified postprocedural states: Secondary | ICD-10-CM

## 2018-12-27 DIAGNOSIS — M76821 Posterior tibial tendinitis, right leg: Secondary | ICD-10-CM

## 2018-12-30 NOTE — Progress Notes (Signed)
   Subjective:  Patient presents today status post posterior tibial tendon repair with navicular exostectomy right. DOS: 11/28/2018. She states she is doing well and has improved a lot. She reports some intermittent sharp pain. She has been doing physical therapy at home and using the CAM boot as directed. Patient is here for further evaluation and treatment.   Past Medical History:  Diagnosis Date  . Asthma   . Carpal tunnel syndrome, right   . Chicken pox   . Depression   . Neck pain       Objective/Physical Exam Neurovascular status intact.  Skin incisions appear to be well coapted. No sign of infectious process noted. No dehiscence. No active bleeding noted. Moderate edema noted to the surgical extremity.  Assessment: 1. s/p posterior tibial tendon repair with navicular exostectomy right. DOS: 11/28/2018   Plan of Care:  1. Patient was evaluated. 2. Begin weightbearing in CAM boot for four weeks.   3. Begin PROM exercises to surgical foot for four weeks.  4. Patient doing home physical therapy program.  5. Return to clinic in 6 weeks.    Edrick Kins, DPM [facility]  Dr. Edrick Kins, [address]                                        [place], [place] [address]                Office [phone]  Fax [phone]

## 2019-02-14 ENCOUNTER — Encounter: Payer: Self-pay | Admitting: *Deleted

## 2019-02-14 ENCOUNTER — Other Ambulatory Visit: Payer: Self-pay

## 2019-02-14 ENCOUNTER — Ambulatory Visit (INDEPENDENT_AMBULATORY_CARE_PROVIDER_SITE_OTHER): Payer: Commercial Managed Care - PPO | Admitting: Podiatry

## 2019-02-14 DIAGNOSIS — Z9889 Other specified postprocedural states: Secondary | ICD-10-CM

## 2019-02-14 DIAGNOSIS — M76821 Posterior tibial tendinitis, right leg: Secondary | ICD-10-CM

## 2019-02-18 NOTE — Progress Notes (Signed)
   Subjective:  Patient presents today status post posterior tibial tendon repair with navicular exostectomy right. DOS: 11/28/2018. She states she is doing well. She reports intermittent minor dull and sharp pain that occurs at night time. She has been doing range of motion exercises which helps alleviate her symptoms. She has been using the CAM boot as directed. There are no specific worsening factors noted. Patient is here for further evaluation and treatment.   Past Medical History:  Diagnosis Date  . Asthma   . Carpal tunnel syndrome, right   . Chicken pox   . Depression   . Neck pain       Objective/Physical Exam Neurovascular status intact.  Skin incisions appear to be well coapted. No sign of infectious process noted. No dehiscence. No active bleeding noted. Moderate edema noted to the surgical extremity.  Assessment: 1. s/p posterior tibial tendon repair with navicular exostectomy right. DOS: 11/28/2018   Plan of Care:  1. Patient was evaluated. 2. Discontinue using CAM boot. Recommended good shoe gear.  3. Continue daily range of motion exercises.  4. Begin working three days per week for 5-6 hours daily beginning on 03/20/2019.  5. Return to clinic in 8 weeks to reassess work schedule.    Felecia Shelling, DPM [facility]  Dr. Felecia Shelling, DPM    [address]                                        [place], [place] [address]                Office [phone]  Fax [phone]

## 2019-04-18 ENCOUNTER — Other Ambulatory Visit: Payer: Self-pay

## 2019-04-18 ENCOUNTER — Encounter: Payer: Self-pay | Admitting: Podiatry

## 2019-04-18 ENCOUNTER — Ambulatory Visit (INDEPENDENT_AMBULATORY_CARE_PROVIDER_SITE_OTHER): Payer: Commercial Managed Care - PPO | Admitting: Podiatry

## 2019-04-18 DIAGNOSIS — M2011 Hallux valgus (acquired), right foot: Secondary | ICD-10-CM | POA: Diagnosis not present

## 2019-04-18 DIAGNOSIS — Z9889 Other specified postprocedural states: Secondary | ICD-10-CM | POA: Diagnosis not present

## 2019-04-18 DIAGNOSIS — M76821 Posterior tibial tendinitis, right leg: Secondary | ICD-10-CM

## 2019-04-18 DIAGNOSIS — Q688 Other specified congenital musculoskeletal deformities: Secondary | ICD-10-CM | POA: Diagnosis not present

## 2019-04-20 NOTE — Progress Notes (Signed)
   Subjective:  Patient presents today status post posterior tibial tendon repair with navicular exostectomy right. DOS: 11/28/2018. She states she is improving. She reports constant pain to the 1st MPJ of the right foot. She reports some tightness of the Achilles. There are no worsening factors noted and she has not been doing any treatments at home. Patient is here for further evaluation and treatment.   Past Medical History:  Diagnosis Date  . Asthma   . Carpal tunnel syndrome, right   . Chicken pox   . Depression   . Neck pain      Objective: Physical Exam General: The patient is alert and oriented x3 in no acute distress.  Dermatology: Skin is cool, dry and supple bilateral lower extremities. Negative for open lesions or macerations.  Vascular: Palpable pedal pulses bilaterally. No edema or erythema noted. Capillary refill within normal limits.  Neurological: Epicritic and protective threshold grossly intact bilaterally.   Musculoskeletal Exam: Pain with palpation and with ROM to the 1st MPJ of the right foot. All pedal and ankle joints range of motion within normal limits bilateral. Muscle strength 5/5 in all groups bilateral.   Assessment: 1. s/p posterior tibial tendon repair with navicular exostectomy right. DOS: 11/28/2018 2. H/o bunion and hammertoe repair digits 2, 3 right    Plan of Care:  1. Patient was evaluated. 2. Today we will increase work to four days per week. 5-6 hours per day.  3. Recommended good shoe gear.  4. Recommended daily stretching and physical therapy home exercises.  5. Return to clinic as needed.    Felecia Shelling, DPM [facility]  Dr. Felecia Shelling, DPM    [address]                                        [place], [place] [address]                Office [phone]  Fax [phone]

## 2019-05-02 ENCOUNTER — Telehealth: Payer: Self-pay | Admitting: *Deleted

## 2019-06-02 NOTE — Telephone Encounter (Signed)
FMLA paperwork was faxed to [employer].

## 2019-06-10 ENCOUNTER — Telehealth: Payer: Self-pay | Admitting: *Deleted

## 2019-06-10 NOTE — Telephone Encounter (Signed)
Stephanie Lyons w/ [employer] is calling and has some questions concerning patient since her last medical request form was submitted on April 23rd.  When was her last appointment w/ physician,when will she be cleared to return for full duty and is she still only able to work 5-6 hours daily 4 days weekly. Will fax a report with questions

## 2019-06-12 ENCOUNTER — Telehealth: Payer: Self-pay | Admitting: *Deleted

## 2019-06-12 NOTE — Telephone Encounter (Signed)
Stephanie Lyons w/ [employer] is calling and has some questions concerning patient's last medical request form that was submitted on April 23rd.  When was her last appointment w/ physician, when will she be cleared to return to work  full time and is she still only able to work 5-6 hours daily 4 days weekly.  Will fax a report with these questions.

## 2019-06-12 NOTE — Telephone Encounter (Signed)
Completed.

## 2019-06-12 NOTE — Telephone Encounter (Signed)
        Signed                      Signed         Show:Clear all [x] Manual[] Template[] Copied  Added by: [x] Kiondre Grenz L, RMA  [] Hover for details w/ [employer] is calling and has some questions concerning patient since her last medical request form was submitted on April 23rd.  When was her last appointment w/ physician,when will she be cleared to return for full duty and is she still only able to work 5-6 hours daily 4 days weekly. Will fax a report with questions        Electronically signed by Abrial Arrighi L, RMA at 06/10/2019 2:08 PM   Telephone on 06/10/2019     Detailed Report

## 2019-06-16 NOTE — Telephone Encounter (Signed)
I am calling you in regards to your FMLA.  Rosann Auerbach is requesting additional information wanting to know when you can work a regular schedule.  We need you to come in to see Dr. Logan Bores for an evaluation because they are requesting additional information.  "Okay, I didn't know they needed anything else."  I scheduled her for 07/04/2019 at 9:15 am.

## 2019-06-16 NOTE — Telephone Encounter (Signed)
I called and informed [name] at [facility] that we have not seen Stephanie Lyons since her visit on 04/18/2019.  She is scheduled for another appointment on 07/04/2019 for an evaluation.  I informed her a decision will be made then if she can return to work full time without restrictions.

## 2019-06-19 ENCOUNTER — Other Ambulatory Visit: Payer: Self-pay | Admitting: Surgery

## 2019-06-30 ENCOUNTER — Encounter: Payer: Self-pay | Admitting: Surgery

## 2019-06-30 ENCOUNTER — Encounter
Admission: RE | Admit: 2019-06-30 | Discharge: 2019-06-30 | Disposition: A | Discharge status: Home / Self Care | Payer: Commercial Managed Care - PPO | Source: Ambulatory Visit | Attending: Surgery | Admitting: Surgery

## 2019-06-30 ENCOUNTER — Other Ambulatory Visit: Payer: Self-pay

## 2019-06-30 DIAGNOSIS — Z01818 Encounter for other preprocedural examination: Secondary | ICD-10-CM | POA: Diagnosis present

## 2019-06-30 HISTORY — DX: Gastro-esophageal reflux disease without esophagitis: K21.9

## 2019-06-30 HISTORY — DX: Unspecified osteoarthritis, unspecified site: M19.90

## 2019-06-30 HISTORY — DX: Family history of other specified conditions: Z84.89

## 2019-06-30 HISTORY — DX: Other complications of anesthesia, initial encounter: T88.59XA

## 2019-06-30 HISTORY — DX: Other specified postprocedural states: Z98.890

## 2019-06-30 HISTORY — DX: Other specified postprocedural states: R11.2

## 2019-06-30 NOTE — Patient Instructions (Signed)
Your procedure is scheduled on: 07-09-19 [facility] Report to Same Day Surgery 2nd floor medical mall [facility] Entrance-take elevator on left to 2nd floor.  Check in with surgery information desk.) To find out your arrival time please call [phone] between 1PM - 3PM on 07-08-19 TUESDAY  Remember: Instructions that are not followed completely may result in serious medical risk, up to and including death, or upon the discretion of your surgeon and anesthesiologist your surgery may need to be rescheduled.    _x___ 1. Do not eat food after midnight the night before your procedure. NO GUM OR CANDY AFTER MIDNIGHT. You may drink clear liquids up to 2 hours before you are scheduled to arrive at the hospital for your procedure.  Do not drink clear liquids within 2 hours of your scheduled arrival to the hospital.  Clear liquids include  --Water or Apple juice without pulp  --Gatorade  --Black Coffee or Clear Tea (No milk, no creamers, do not add anything to the coffee or Tea-ok to add sugar)  _X___Ensure clear carbohydrate drink-FINISH DRINK 2 HOURS PRIOR TO ARRIVAL TIME TO HOSPITAL DAY OF SURGERY    __x__ 2. No Alcohol for 24 hours before or after surgery.   __x__3. No Smoking or e-cigarettes for 24 prior to surgery.  Do not use any chewable tobacco products for at least 6 hour prior to surgery   ____  4. Bring all medications with you on the day of surgery if instructed.    __x__ 5. Notify your doctor if there is any change in your medical condition     (cold, fever, infections).    x___6. On the morning of surgery brush your teeth with toothpaste and water.  You may rinse your mouth with mouth wash if you wish.  Do not swallow any toothpaste or mouthwash.   Do not wear jewelry, make-up, hairpins, clips or nail polish.  Do not wear lotions, powders, or perfumes.  Do not shave 48 hours prior to surgery. Men may shave face and neck.  Do not bring valuables to the hospital.    [facility] is not responsible for any belongings or valuables.               Contacts, dentures or bridgework may not be worn into surgery.  Leave your suitcase in the car. After surgery it may be brought to your room.  For patients admitted to the hospital, discharge time is determined by your treatment team.  _  Patients discharged the day of surgery will not be allowed to drive home.  You will need someone to drive you home and stay with you the night of your procedure.    Please read over the following fact sheets that you were given:   [facility] Preparing for [place] TO YOU IN SAME DAY SURGERY  _x___ TAKE THE FOLLOWING MEDICATION THE MORNING OF SURGERY WITH A SMALL SIP OF WATER. These include:  1. NONE  2.  3.  4.  5.  6.  ____Fleets enema or Magnesium Citrate as directed.   _x___ Use CHG Soap or sage wipes as directed on instruction sheet   _X___ Use inhalers on the day of surgery and bring to hospital day of surgery-USE YOUR ALBUTEROL INHALER AT [place]  ____ Stop Metformin and Janumet 2 days prior to surgery.    ____ Take 1/2 of usual insulin dose  the night before surgery and none on the morning surgery.   ____ Follow recommendations from Cardiologist, Pulmonologist or PCP regarding stopping Aspirin, Coumadin, Plavix ,Eliquis, Effient, or Pradaxa, and Pletal.  X____Stop Anti-inflammatories such as Advil, Aleve, Ibuprofen, Motrin, Naproxen, Naprosyn, Goodies powders or aspirin products 7 DAYS PRIOR TO SURGERY-OK to take Tylenol    _x___ Stop supplements until after surgery-STOP YOUR APPLE CIDER VINEGAR 7 DAYS PRIOR TO SURGERY-YOU MAY RESUME AFTER YOUR SURGERY   ____ Bring C-Pap to the hospital.

## 2019-07-04 ENCOUNTER — Encounter: Payer: Commercial Managed Care - PPO | Admitting: Podiatry

## 2019-07-07 ENCOUNTER — Other Ambulatory Visit
Admission: RE | Admit: 2019-07-07 | Discharge: 2019-07-07 | Disposition: A | Discharge status: Home / Self Care | Payer: Commercial Managed Care - PPO | Source: Ambulatory Visit | Attending: Surgery | Admitting: Surgery

## 2019-07-07 ENCOUNTER — Other Ambulatory Visit: Payer: Self-pay

## 2019-07-07 DIAGNOSIS — Z01812 Encounter for preprocedural laboratory examination: Secondary | ICD-10-CM | POA: Insufficient documentation

## 2019-07-07 DIAGNOSIS — Z20822 Contact with and (suspected) exposure to covid-19: Secondary | ICD-10-CM | POA: Diagnosis not present

## 2019-07-07 LAB — SARS CORONAVIRUS 2 (TAT 6-24 HRS): SARS Coronavirus 2: NEGATIVE

## 2019-07-08 ENCOUNTER — Other Ambulatory Visit: Payer: Self-pay

## 2019-07-08 ENCOUNTER — Encounter: Payer: Self-pay | Admitting: *Deleted

## 2019-07-08 ENCOUNTER — Ambulatory Visit (INDEPENDENT_AMBULATORY_CARE_PROVIDER_SITE_OTHER): Payer: Commercial Managed Care - PPO | Admitting: Podiatry

## 2019-07-08 ENCOUNTER — Encounter: Payer: Self-pay | Admitting: Podiatry

## 2019-07-08 DIAGNOSIS — M2011 Hallux valgus (acquired), right foot: Secondary | ICD-10-CM

## 2019-07-08 DIAGNOSIS — Q688 Other specified congenital musculoskeletal deformities: Secondary | ICD-10-CM

## 2019-07-08 DIAGNOSIS — M659 Synovitis and tenosynovitis, unspecified: Secondary | ICD-10-CM

## 2019-07-08 DIAGNOSIS — Z9889 Other specified postprocedural states: Secondary | ICD-10-CM

## 2019-07-08 DIAGNOSIS — M76821 Posterior tibial tendinitis, right leg: Secondary | ICD-10-CM | POA: Diagnosis not present

## 2019-07-08 DIAGNOSIS — M7751 Other enthesopathy of right foot: Secondary | ICD-10-CM

## 2019-07-08 NOTE — Progress Notes (Signed)
   Subjective:  Patient presents today for evaluation associated to chronic right foot and ankle pain.  Patient has a history of multiple surgeries to the right foot and ankle.  Currently her work requires her to stand intermittently throughout the day.  She states that she begins to experience and increase of pain that is exacerbated after working longer than 8-hour shifts.  She has been wearing OTC insoles and wearing good supportive shoes and taking OTC Motrin as needed for the pain.  Past Medical History:  Diagnosis Date  . Arthritis   . Asthma   . Carpal tunnel syndrome, right   . Chicken pox   . Complication of anesthesia   . Depression   . Family history of adverse reaction to anesthesia    brother becomes aggressive  . GERD (gastroesophageal reflux disease)    rare  . Neck pain   . PONV (postoperative nausea and vomiting)    PT STATES AS LONG AS ANTIEMETIC GIVEN THRU IV SHE DOES WELL AND DOES NOT GET SICK-HAS NEVER HAD SCOPALAMINE PATCH     Objective: Physical Exam General: The patient is alert and oriented x3 in no acute distress.  Dermatology: Skin is cool, dry and supple bilateral lower extremities. Negative for open lesions or macerations.  Vascular: Palpable pedal pulses bilaterally. No edema or erythema noted. Capillary refill within normal limits.  Neurological: Epicritic and protective threshold grossly intact bilaterally.   Musculoskeletal Exam: Pain with palpation and with ROM to the 1st MPJ of the right foot.  There is also some limited range of motion to the first MTPJ.  All pedal and ankle joints range of motion within normal limits bilateral. Muscle strength 5/5 in all groups bilateral.   Assessment: 1. s/p posterior tibial tendon repair with navicular exostectomy right. DOS: 11/28/2018 2. H/o bunion and hammertoe repair digits 2, 3 right  3.  Metatarsophalangeal capsulitis/DJD/hallux limitus right 4.  Chronic foot pain right   Plan of Care:  1. Patient  was evaluated. 2.  Patient has chronic pain to the right foot and ankle that is exacerbated with prolonged standing and working greater than 8-hour shifts.  Note provided today for restrictions of working 4 days/week no longer than 8 hour shifts indefinitely.  Anything past 8 hours exacerbates her chronic foot pain 3.  Continue OTC Motrin as needed 4.  Continue OTC insoles and good supportive shoes 5.  Return to clinic as needed  Felecia Shelling, DPM [facility]  Dr. Felecia Shelling, DPM    [address]                                        [place], [place] [address]                Office [phone]  Fax [phone]

## 2019-07-09 ENCOUNTER — Encounter: Payer: Self-pay | Admitting: Surgery

## 2019-07-09 ENCOUNTER — Ambulatory Visit: Payer: Commercial Managed Care - PPO | Admitting: Anesthesiology

## 2019-07-09 ENCOUNTER — Ambulatory Visit
Admission: RE | Admit: 2019-07-09 | Discharge: 2019-07-09 | Disposition: A | Discharge status: Home / Self Care | Payer: Commercial Managed Care - PPO | Attending: Surgery | Admitting: Surgery

## 2019-07-09 ENCOUNTER — Encounter
Admission: RE | Disposition: A | Discharge status: Home / Self Care | Payer: Self-pay | Source: Home / Self Care | Attending: Surgery

## 2019-07-09 ENCOUNTER — Other Ambulatory Visit: Payer: Self-pay

## 2019-07-09 DIAGNOSIS — M67432 Ganglion, left wrist: Secondary | ICD-10-CM | POA: Diagnosis not present

## 2019-07-09 DIAGNOSIS — F17210 Nicotine dependence, cigarettes, uncomplicated: Secondary | ICD-10-CM | POA: Diagnosis not present

## 2019-07-09 DIAGNOSIS — Z79899 Other long term (current) drug therapy: Secondary | ICD-10-CM | POA: Diagnosis not present

## 2019-07-09 DIAGNOSIS — E669 Obesity, unspecified: Secondary | ICD-10-CM | POA: Insufficient documentation

## 2019-07-09 DIAGNOSIS — G5602 Carpal tunnel syndrome, left upper limb: Secondary | ICD-10-CM | POA: Diagnosis present

## 2019-07-09 DIAGNOSIS — J45909 Unspecified asthma, uncomplicated: Secondary | ICD-10-CM | POA: Insufficient documentation

## 2019-07-09 HISTORY — PX: CARPAL TUNNEL RELEASE: SHX101

## 2019-07-09 LAB — POCT PREGNANCY, URINE: Preg Test, Ur: NEGATIVE

## 2019-07-09 SURGERY — RELEASE, CARPAL TUNNEL, ENDOSCOPIC
Anesthesia: General | Site: Wrist | Laterality: Left

## 2019-07-09 MED ORDER — ONDANSETRON HCL 4 MG/2ML IJ SOLN
INTRAMUSCULAR | Status: DC | PRN
  Administered 2019-07-09 (×2): 4 mg via INTRAVENOUS

## 2019-07-09 MED ORDER — PROPOFOL 10 MG/ML IV BOLUS
INTRAVENOUS | Status: AC
  Filled 2019-07-09: qty 20

## 2019-07-09 MED ORDER — TRAMADOL HCL 50 MG PO TABS: 50.0000 mg | ORAL_TABLET | Freq: Four times a day (QID) | ORAL | Status: DC | PRN

## 2019-07-09 MED ORDER — FENTANYL CITRATE (PF) 100 MCG/2ML IJ SOLN
25.0000 ug | INTRAMUSCULAR | Status: DC | PRN
  Administered 2019-07-09 (×2): 25 ug via INTRAVENOUS

## 2019-07-09 MED ORDER — PROMETHAZINE HCL 25 MG/ML IJ SOLN: 6.2500 mg | INTRAMUSCULAR | Status: DC | PRN

## 2019-07-09 MED ORDER — CHLORHEXIDINE GLUCONATE 0.12 % MT SOLN
OROMUCOSAL | Status: AC
  Administered 2019-07-09: 15 mL via OROMUCOSAL
  Filled 2019-07-09: qty 15

## 2019-07-09 MED ORDER — ONDANSETRON HCL 4 MG PO TABS: 4.0000 mg | ORAL_TABLET | Freq: Four times a day (QID) | ORAL | Status: DC | PRN

## 2019-07-09 MED ORDER — FENTANYL CITRATE (PF) 100 MCG/2ML IJ SOLN
INTRAMUSCULAR | Status: AC
  Filled 2019-07-09: qty 2

## 2019-07-09 MED ORDER — PROPOFOL 10 MG/ML IV BOLUS
INTRAVENOUS | Status: DC | PRN
  Administered 2019-07-09: 120 mg via INTRAVENOUS

## 2019-07-09 MED ORDER — POTASSIUM CHLORIDE IN NACL 20-0.9 MEQ/L-% IV SOLN
INTRAVENOUS | Status: DC
  Filled 2019-07-09: qty 1000

## 2019-07-09 MED ORDER — KETOROLAC TROMETHAMINE 30 MG/ML IJ SOLN
INTRAMUSCULAR | Status: AC
  Filled 2019-07-09: qty 1

## 2019-07-09 MED ORDER — SCOPOLAMINE 1 MG/3DAYS TD PT72
MEDICATED_PATCH | TRANSDERMAL | Status: AC
  Filled 2019-07-09: qty 1

## 2019-07-09 MED ORDER — METOCLOPRAMIDE HCL 10 MG PO TABS: 5.0000 mg | ORAL_TABLET | Freq: Three times a day (TID) | ORAL | Status: DC | PRN

## 2019-07-09 MED ORDER — ORAL CARE MOUTH RINSE: 15.0000 mL | Freq: Once | OROMUCOSAL | Status: AC

## 2019-07-09 MED ORDER — CHLORHEXIDINE GLUCONATE 0.12 % MT SOLN: 15.0000 mL | Freq: Once | OROMUCOSAL | Status: AC

## 2019-07-09 MED ORDER — BUPIVACAINE HCL (PF) 0.5 % IJ SOLN
INTRAMUSCULAR | Status: DC | PRN
  Administered 2019-07-09: 10 mL

## 2019-07-09 MED ORDER — FAMOTIDINE 20 MG PO TABS: 20.0000 mg | ORAL_TABLET | Freq: Once | ORAL | Status: AC

## 2019-07-09 MED ORDER — CEFAZOLIN SODIUM-DEXTROSE 2-4 GM/100ML-% IV SOLN
2.0000 g | INTRAVENOUS | Status: AC
  Administered 2019-07-09: 2 g via INTRAVENOUS

## 2019-07-09 MED ORDER — MIDAZOLAM HCL 2 MG/2ML IJ SOLN
INTRAMUSCULAR | Status: AC
  Filled 2019-07-09: qty 2

## 2019-07-09 MED ORDER — BUPIVACAINE HCL (PF) 0.5 % IJ SOLN
INTRAMUSCULAR | Status: AC
  Filled 2019-07-09: qty 30

## 2019-07-09 MED ORDER — FAMOTIDINE 20 MG PO TABS
ORAL_TABLET | ORAL | Status: AC
  Administered 2019-07-09: 20 mg via ORAL
  Filled 2019-07-09: qty 1

## 2019-07-09 MED ORDER — LIDOCAINE HCL (CARDIAC) PF 100 MG/5ML IV SOSY
PREFILLED_SYRINGE | INTRAVENOUS | Status: DC | PRN
  Administered 2019-07-09: 100 mg via INTRAVENOUS

## 2019-07-09 MED ORDER — CEFAZOLIN SODIUM-DEXTROSE 2-4 GM/100ML-% IV SOLN
INTRAVENOUS | Status: AC
  Filled 2019-07-09: qty 100

## 2019-07-09 MED ORDER — ACETAMINOPHEN 10 MG/ML IV SOLN
INTRAVENOUS | Status: AC
  Filled 2019-07-09: qty 100

## 2019-07-09 MED ORDER — KETOROLAC TROMETHAMINE 30 MG/ML IJ SOLN
30.0000 mg | Freq: Once | INTRAMUSCULAR | Status: AC
  Administered 2019-07-09: 30 mg via INTRAVENOUS

## 2019-07-09 MED ORDER — FENTANYL CITRATE (PF) 100 MCG/2ML IJ SOLN
INTRAMUSCULAR | Status: DC | PRN
  Administered 2019-07-09 (×2): 50 ug via INTRAVENOUS

## 2019-07-09 MED ORDER — LACTATED RINGERS IV SOLN: INTRAVENOUS | Status: DC

## 2019-07-09 MED ORDER — SCOPOLAMINE 1 MG/3DAYS TD PT72
1.0000 | MEDICATED_PATCH | TRANSDERMAL | Status: DC
  Administered 2019-07-09: 1.5 mg via TRANSDERMAL

## 2019-07-09 MED ORDER — METOCLOPRAMIDE HCL 5 MG/ML IJ SOLN: 5.0000 mg | Freq: Three times a day (TID) | INTRAMUSCULAR | Status: DC | PRN

## 2019-07-09 MED ORDER — MIDAZOLAM HCL 2 MG/2ML IJ SOLN
INTRAMUSCULAR | Status: DC | PRN
  Administered 2019-07-09: 2 mg via INTRAVENOUS

## 2019-07-09 MED ORDER — ONDANSETRON HCL 4 MG/2ML IJ SOLN: 4.0000 mg | Freq: Four times a day (QID) | INTRAMUSCULAR | Status: DC | PRN

## 2019-07-09 MED ORDER — DEXAMETHASONE SODIUM PHOSPHATE 10 MG/ML IJ SOLN
INTRAMUSCULAR | Status: DC | PRN
  Administered 2019-07-09: 5 mg via INTRAVENOUS

## 2019-07-09 MED ORDER — PHENYLEPHRINE HCL (PRESSORS) 10 MG/ML IV SOLN
INTRAVENOUS | Status: DC | PRN
  Administered 2019-07-09 (×2): 100 ug via INTRAVENOUS

## 2019-07-09 SURGICAL SUPPLY — 31 items
BNDG COHESIVE 4X5 TAN STRL (GAUZE/BANDAGES/DRESSINGS) ×2 IMPLANT
BNDG ELASTIC 2X5.8 VLCR STR LF (GAUZE/BANDAGES/DRESSINGS) ×2 IMPLANT
BNDG ESMARK 4X12 TAN STRL LF (GAUZE/BANDAGES/DRESSINGS) ×2 IMPLANT
CANISTER SUCT 1200ML W/VALVE (MISCELLANEOUS) ×2 IMPLANT
CHLORAPREP W/TINT 26 (MISCELLANEOUS) ×2 IMPLANT
CORD BIP STRL DISP 12FT (MISCELLANEOUS) ×2 IMPLANT
COVER WAND RF STERILE (DRAPES) ×2 IMPLANT
CUFF TOURN SGL QUICK 18X4 (TOURNIQUET CUFF) ×2 IMPLANT
DRAPE SURG 17X11 SM STRL (DRAPES) ×2 IMPLANT
FORCEPS JEWEL BIP 4-3/4 STR (INSTRUMENTS) ×2 IMPLANT
GAUZE SPONGE 4X4 12PLY STRL (GAUZE/BANDAGES/DRESSINGS) ×2 IMPLANT
GAUZE XEROFORM 1X8 LF (GAUZE/BANDAGES/DRESSINGS) ×2 IMPLANT
GLOVE BIO SURGEON STRL SZ8 (GLOVE) ×2 IMPLANT
GLOVE INDICATOR 8.0 STRL GRN (GLOVE) ×2 IMPLANT
GOWN STRL REUS W/ TWL LRG LVL3 (GOWN DISPOSABLE) ×1 IMPLANT
GOWN STRL REUS W/ TWL XL LVL3 (GOWN DISPOSABLE) ×1 IMPLANT
GOWN STRL REUS W/TWL LRG LVL3 (GOWN DISPOSABLE) ×1
GOWN STRL REUS W/TWL XL LVL3 (GOWN DISPOSABLE) ×1
KIT CARPAL TUNNEL (MISCELLANEOUS) ×1
KIT ESCP INSRT D SLOT CANN KN (MISCELLANEOUS) ×1 IMPLANT
KIT TURNOVER KIT A (KITS) ×2 IMPLANT
NS IRRIG 500ML POUR BTL (IV SOLUTION) ×2 IMPLANT
PACK EXTREMITY (MISCELLANEOUS) ×2 IMPLANT
SPLINT WRIST LG LT TX990309 (SOFTGOODS) IMPLANT
SPLINT WRIST LG RT TX900304 (SOFTGOODS) IMPLANT
SPLINT WRIST M LT TX990308 (SOFTGOODS) ×2 IMPLANT
SPLINT WRIST M RT TX990303 (SOFTGOODS) IMPLANT
SPLINT WRIST XL LT TX990310 (SOFTGOODS) IMPLANT
SPLINT WRIST XL RT TX990305 (SOFTGOODS) IMPLANT
STOCKINETTE IMPERVIOUS 9X36 MD (GAUZE/BANDAGES/DRESSINGS) ×2 IMPLANT
SUT PROLENE 4 0 PS 2 18 (SUTURE) ×2 IMPLANT

## 2019-07-09 NOTE — Progress Notes (Signed)
Patient educated on incentive spirometer.  

## 2019-07-09 NOTE — Transfer of Care (Signed)
Immediate Anesthesia Transfer of Care Note  Patient: Stephanie Lyons  Procedure(s) Performed: CARPAL TUNNEL RELEASE ENDOSCOPIC WITH EXCISION OF LEFT DORAL CARPAL GANGLION (Left Wrist)  Patient Location: PACU  Anesthesia Type:General  Level of Consciousness: awake, alert  and oriented  Airway & Oxygen Therapy: Patient Spontanous Breathing and Patient connected to face mask oxygen  Post-op Assessment: Report given to RN and Post -op Vital signs reviewed and stable  Post vital signs: Reviewed and stable  Last Vitals:  Vitals Value Taken Time  BP 91/50 07/09/19 1307  Temp 36.2 C 07/09/19 1307  Pulse 54 07/09/19 1307  Resp 16 07/09/19 1307  SpO2 97 % 07/09/19 1307    Last Pain:  Vitals:   07/09/19 1307  TempSrc: Temporal  PainSc: 4       Patients Stated Pain Goal: 0 (07/09/19 1307)  Complications: No complications documented.

## 2019-07-09 NOTE — H&P (Signed)
History of Present Illness:  Stephanie Lyons is a 39 y.o. female who presents for history and physical for left endoscopic carpal tunnel release with left wrist dorsal ganglion cyst excision, date of surgery 07/09/2019 with Dr. Joice Lofts. Soft tissue mass along the dorsum of the left wrist has been present for several years but have worsened over the past few months. The patient works as a Production designer, theatre/television/film at [employer] and needs to use her hands for repetitive activities frequently during the day. She denies any specific injury to the wrist however. Her symptoms are aggravated by repetitive use of the wrist and with direct pressure on the mass.   The patient also notes a several year history of numbness and tingling to the thumb, index, and long fingers of her left hand. She is status post a mini open right carpal tunnel release in the past from which she has done quite well. She was sent by Dedra Skeens, PA-C, to neurology for an EMG which demonstrated evidence of "mild left carpal tunnel syndrome" in October, 2020. The patient notes continued symptoms. The numbness and paresthesias will occasionally awaken her from sleep, and can flareup during the day when at work, especially with repetitive activities.  Current Outpatient Medications: . albuterol 90 mcg/actuation inhaler Inhale 2 inhalations into the lungs every 6 (six) hours as needed for Wheezing. 1 Inhaler 1  . diclofenac (VOLTAREN) 1 % topical gel Apply 2 g topically 4 (four) times daily 200 g 3  . ibuprofen (ADVIL,MOTRIN) 200 MG tablet Take 800 mg by mouth as needed for Pain  . traMADol (ULTRAM) 50 mg tablet Take 1 tablet (50 mg total) by mouth every 6 (six) hours as needed for Pain. 40 tablet 1  . valACYclovir (VALTREX) 1000 MG tablet Take 1,000 mg by mouth once daily   No current Epic-ordered facility-administered medications on file.   Allergies: Mar[place] Kitchen Morphine Other (See Comments)  Hypotension  . Percocet [Oxycodone-Acetaminophen] Vomiting and  Nausea And Vomiting  . Prednisone Hallucination   Past Medical History:  . Anxiety  . Asthma without status asthmaticus, unspecified  . Carpal tunnel syndrome of right wrist 03/30/2015  . Cervical radiculopathy 08/07/2014  . Depression  . History of cold sores 07/04/2015  . History of drug abuse (CMS-HCC) 07/04/2015  . HSV-2 (herpes simplex virus 2) infection  . Mild carpal tunnel syndrome of right wrist  chronic by NCS 03/22/2015  . Obesity (BMI 30.0-34.9), unspecified 10/19/2016  . Tobacco abuse 07/04/2015  . Trigger finger of left hand  pinky and ring   Past Surgical History:  . BUNION CORRECTION Left 2006  . BUNION CORRECTION 2020  . ENDOSCOPIC CARPAL TUNNEL RELEASE Right 04/02/2015  . foot surgery Right 2020  . TONSILLECTOMY 2011  . trigger finger release Left 01/29/2017  Pinky and ring  . trigger finger release Left 05/23/2017  pinky   Family History  . High blood pressure (Hypertension) Mother  . Arthritis Mother  . Arthritis Maternal Grandmother  . Lupus Cousin   Social History   Socioeconomic History  . Marital status: Single  Spouse name: Not on file  . Number of children: Not on file  . Years of education: Not on file  . Highest education level: Not on file  Occupational History  . Not on file  Tobacco Use  . Smoking status: Light Tobacco Smoker  Packs/day: 0.25  . Smokeless tobacco: Never Used  Vaping Use  . Vaping Use: Never used  Substance and Sexual Activity  . Alcohol  use: No  Alcohol/week: 0.0 standard drinks  . Drug use: No  . Sexual activity: Defer  Other Topics Concern  . Not on file  Social History Narrative  . Not on file   Social Determinants of Health   Financial Resource Strain:  . Difficulty of Paying Living Expenses:  Food Insecurity:  . Worried About Programme researcher, broadcasting/film/video in the Last Year:  . Barista in the Last Year:  Transportation Needs:  . Freight forwarder (Medical):  [place] Kitchen Lack of Transportation (Non-Medical):    Review of Systems:  A comprehensive 14 point ROS was performed, reviewed, and the pertinent orthopaedic findings are documented in the HPI.  Physical Exam: There were no vitals filed for this visit. General:  Well developed, well nourished, no apparent distress, normal affect, normal gait with no antalgic component.   HEENT: Head normocephalic, atraumatic, PERRL.   Abdomen: Soft, non tender, non distended, Bowel sounds present.  Heart: Examination of the heart reveals regular, rate, and rhythm. There is no murmur noted on ascultation. There is a normal apical pulse.  Lungs: Lungs are clear to auscultation. There is no wheeze, rhonchi, or crackles. There is normal expansion of bilateral chest walls.   Left wrist exam: Skin inspection of the left wrist is notable for a small firm mass on the dorsal aspect of her wrist just distal to the radiocarpal joint which appears to be fluid-filled. It measures approximately 1-1.2 cm in diameter and is moderately tender to palpation. She also notes some discomfort at the extremes of wrist extension more so than with wrist flexion. She is able to actively flex and extend all digits without any pain or triggering. She is neurovascularly intact to the left hand. She has an equivocally positive Phalen's test, but it but a negative Tinel's test. No significant thenar atrophy  X-rays/MRI/Lab data:  Recent AP, lateral, and oblique x-rays of the left hand are available for review and have been reviewed by myself. These films demonstrate no evidence of fractures, lytic lesions, or significant degenerative changes.  A recent EMG of the left upper extremity also is available for review. By report, the study demonstrates evidence of "very mild left carpal tunnel syndrome." Again, this report was reviewed by myself and discussed with the patient.  Assessment: 1. Carpal tunnel syndrome of left wrist 2.  Dorsal carpal ganglion, left wrist  Plan: The risks,  benefits, and complications of a left dorsal wrist ganglion cyst excision as well as a left endoscopic carpal tunnel release have been discussed with the patient.  The patient has agreed and consented to the procedure with Dr. Joice Lofts on 07/09/2019.   H&P reviewed and patient re-examined. No changes.

## 2019-07-09 NOTE — Discharge Instructions (Addendum)
AMBULATORY SURGERY  DISCHARGE INSTRUCTIONS   1) The drugs that you were given will stay in your system until tomorrow so for the next 24 hours you should not:  A) Drive an automobile B) Make any legal decisions C) Drink any alcoholic beverage   2) You may resume regular meals tomorrow.  Today it is better to start with liquids and gradually work up to solid foods.  You may eat anything you prefer, but it is better to start with liquids, then soup and crackers, and gradually work up to solid foods.   3) Please notify your doctor immediately if you have any unusual bleeding, trouble breathing, redness and pain at the surgery site, drainage, fever, or pain not relieved by medication.    4) Additional Instructions:        Please contact your physician with any problems or Same Day Surgery at [phone], Monday through Friday 6 am to 4 pm, or [place] at [facility] number at [phone].Orthopedic discharge instructions: Keep dressing dry and intact. Keep hand elevated above heart level. May shower after dressing removed on postop day 4 (Sunday). Cover sutures with Band-Aids after drying off. Apply ice to affected area frequently. Take ibuprofen 600-800 mg TID with meals for 7-10 days, then as necessary. Take ES Tylenol or pain medication as prescribed when needed.  Return for follow-up in 10-14 days or as scheduled.   AMBULATORY SURGERY  DISCHARGE INSTRUCTIONS   5) The drugs that you were given will stay in your system until tomorrow so for the next 24 hours you should not:  D) Drive an automobile E) Make any legal decisions F) Drink any alcoholic beverage   6) You may resume regular meals tomorrow.  Today it is better to start with liquids and gradually work up to solid foods.  You may eat anything you prefer, but it is better to start with liquids, then soup and crackers, and gradually work up to solid foods.   7) Please notify your doctor immediately if  you have any unusual bleeding, trouble breathing, redness and pain at the surgery site, drainage, fever, or pain not relieved by medication.    8) Additional Instructions:        Please contact your physician with any problems or Same Day Surgery at [phone], Monday through Friday 6 am to 4 pm, or [place] at [facility] number at [phone].

## 2019-07-09 NOTE — Anesthesia Procedure Notes (Signed)
Procedure Name: LMA Insertion Date/Time: 07/09/2019 10:40 AM Performed by: Junious Silk, CRNA Pre-anesthesia Checklist: Patient identified, Patient being monitored, Timeout performed, Emergency Drugs available and Suction available Patient Re-evaluated:Patient Re-evaluated prior to induction Oxygen Delivery Method: Circle system utilized Preoxygenation: Pre-oxygenation with 100% oxygen Induction Type: IV induction Ventilation: Mask ventilation without difficulty LMA: LMA inserted LMA Size: 4.0 Tube type: Oral Number of attempts: 1 Placement Confirmation: positive ETCO2 and breath sounds checked- equal and bilateral Tube secured with: Tape Dental Injury: Teeth and Oropharynx as per pre-operative assessment

## 2019-07-09 NOTE — Op Note (Signed)
07/09/2019  11:15 AM  Patient:   Stephanie Lyons  Pre-Op Diagnosis:   1.  Left carpal tunnel syndrome.  2.  Left dorsal carpal ganglion.  Post-Op Diagnosis:   Same.  Procedure:   1.  Endoscopic left carpal tunnel release.  2.  Excision of left dorsal carpal ganglion.  Surgeon:   Maryagnes Amos, MD  Assistant:   Nyra Jabs, PA-S  Anesthesia:   General LMA  Findings:   As above.  Complications:   None  EBL:   0 cc  Fluids:   500 cc crystalloid  TT:   30 minutes at 250 mmHg  Drains:   None  Closure:   4-0 Prolene interrupted sutures  Brief Clinical Note:   The patient is a 39 year old female with a history of pain and paresthesias to her left hand. Her symptoms have persisted despite medications, activity modification, etc. Her history and examination consistent with carpal tunnel syndrome, confirmed by EMG. The patient presents at this time for an endoscopic left carpal tunnel release.  The patient also has noted a several month history of a painful mass on the dorsal aspect of her left wrist which developed without any specific cause or injury. The symptoms have persisted despite medications, activity modification, etc. Her history and examination are consistent with a left dorsal carpal ganglion. She presents at this time for excision of the left dorsal carpal ganglion cyst as well.  Procedure:   The patient was brought into the operating room and lain in the supine position. After adequate general laryngeal mask anesthesia was obtained, the left hand and upper extremity were prepped with ChloraPrep solution before being draped sterilely. Preoperative antibiotics were administered. A timeout was performed to verify the appropriate surgical site before the limb was exsanguinated with an Esmarch and the tourniquet inflated to 250 mmHg. An approximately 1.5-2 cm incision was made over the volar wrist flexion crease, centered over the palmaris longus tendon. The incision was  carried down through the subcutaneous tissues with care taken to identify and protect any neurovascular structures. The distal forearm fascia was penetrated just proximal to the transverse carpal ligament. The soft tissues were released off the superficial and deep surfaces of the distal forearm fascia and this was released proximally for 3-4 cm under direct visualization.  Attention was directed distally. The Therapist, nutritional was passed beneath the transverse carpal ligament along the ulnar aspect of the carpal tunnel and used to release any adhesions as well as to remove any adherent synovial tissue before first the smaller then the larger of the two dilators were passed beneath the transverse carpal ligament along the ulnar margin of the carpal tunnel. The slotted cannula was introduced and the endoscope was placed into the slotted cannula and the undersurface of the transverse carpal ligament visualized. The distal margin of the transverse carpal ligament was marked by placing a 25-gauge needle percutaneously at Kaplan's cardinal point so that it entered the distal portion of the slotted cannula. Under endoscopic visualization, the transverse carpal ligament was released from proximal to distal using the end-cutting blade. A second pass was performed to ensure complete release of the ligament. The adequacy of release was verified both endoscopically and by palpation using the freer elevator.  The wound was irrigated thoroughly with sterile saline solution before being closed using 4-0 Prolene interrupted sutures.  Next, the dorsal carpal ganglion was addressed.  An approximately 1.5-2 cm incision was made transversely over the cyst in line with the wrist extension  crease. The incision was carried down through the subcutaneous tissues with care taken to identify and protect any neurovascular structures. A portion of the extensor retinaculum was incised to permit further access to the cyst. The tendons were  dissected radially and ulnarly in order to dissect down onto the cyst. Once the cyst was identified, it was dissected out circumferentially, tracking it down to the stalk, before it was removed. During its removal, the cyst ruptured, releasing approximately 0.5 cc of a straw-colored gelatinous material, consistent with a ganglion cyst. The cyst was tracked down to the carpus. A small amount of capsule was removed from the carpus in order to minimize the likelihood of recurrence. The wound was irrigated thoroughly with sterile saline solution before being closed using 4-0 Prolene interrupted sutures.   A total of 10 cc of 0.5% plain Sensorcaine was injected in and around the incision before a sterile bulky dressing was applied to the wounds. The patient was placed into a volar wrist splint before being awakened, extubated, and returned to the recovery room in satisfactory condition after tolerating the procedure well.

## 2019-07-09 NOTE — Anesthesia Preprocedure Evaluation (Signed)
Anesthesia Evaluation  Patient identified by MRN, date of birth, ID band Patient awake    Reviewed: Allergy & Precautions, H&P , NPO status , Patient's Chart, lab work & pertinent test results  History of Anesthesia Complications (+) PONV and history of anesthetic complications  Airway Mallampati: II  TM Distance: >3 FB     Dental  (+) Teeth Intact   Pulmonary asthma , Current Smoker and Patient abstained from smoking.,    breath sounds clear to auscultation       Cardiovascular negative cardio ROS   Rhythm:regular Rate:Normal     Neuro/Psych PSYCHIATRIC DISORDERS Depression negative neurological ROS     GI/Hepatic Neg liver ROS, GERD  Controlled,  Endo/Other  negative endocrine ROS  Renal/GU      Musculoskeletal   Abdominal   Peds  Hematology negative hematology ROS (+)   Anesthesia Other Findings Past Medical History: No date: Arthritis No date: Asthma No date: Carpal tunnel syndrome, right No date: Chicken pox No date: Complication of anesthesia No date: Depression No date: Family history of adverse reaction to anesthesia     Comment:  brother becomes aggressive No date: GERD (gastroesophageal reflux disease)     Comment:  rare No date: Neck pain No date: PONV (postoperative nausea and vomiting)     Comment:  PT STATES AS LONG AS ANTIEMETIC GIVEN THRU IV SHE DOES               WELL AND DOES NOT GET SICK-HAS NEVER HAD SCOPALAMINE               PATCH  Past Surgical History: 04/02/2015: CARPAL TUNNEL RELEASE; Right     Comment:  Procedure: OPEN CARPAL TUNNEL RELEASE;  Surgeon: Erin Sons, MD;  Location: [facility];  Service:               Orthopedics;  Laterality: Right; No date: FOOT SURGERY; Right No date: HAND SURGERY     Comment:  R- hand 2019, L-hand (2x) 2019 No date: left foot surgery No date: SHOULDER SURGERY No date: TONSILLECTOMY  BMI    Body Mass Index:  20.67 kg/m      Reproductive/Obstetrics negative OB ROS                             Anesthesia Physical Anesthesia Plan  ASA: II  Anesthesia Plan: General LMA   Post-op Pain Management:    Induction:   PONV Risk Score and Plan: Dexamethasone, Ondansetron, Midazolam, Treatment may vary due to age or medical condition and Scopolamine patch - Pre-op  Airway Management Planned:   Additional Equipment:   Intra-op Plan:   Post-operative Plan:   Informed Consent: I have reviewed the patients History and Physical, chart, labs and discussed the procedure including the risks, benefits and alternatives for the proposed anesthesia with the patient or authorized representative who has indicated his/her understanding and acceptance.     Dental Advisory Given  Plan Discussed with: Anesthesiologist, CRNA and Surgeon  Anesthesia Plan Comments:         Anesthesia Quick Evaluation

## 2019-07-10 NOTE — Anesthesia Postprocedure Evaluation (Signed)
Anesthesia Post Note  Patient: Stephanie Lyons  Procedure(s) Performed: CARPAL TUNNEL RELEASE ENDOSCOPIC WITH EXCISION OF LEFT DORAL CARPAL GANGLION (Left Wrist)  Patient location during evaluation: PACU Anesthesia Type: General Level of consciousness: awake and alert Pain management: pain level controlled Vital Signs Assessment: post-procedure vital signs reviewed and stable Respiratory status: spontaneous breathing, nonlabored ventilation and respiratory function stable Cardiovascular status: blood pressure returned to baseline and stable Postop Assessment: no apparent nausea or vomiting Anesthetic complications: no   No complications documented.   Last Vitals:  Vitals:   07/09/19 1255 07/09/19 1307  BP: (!) 93/58 (!) 91/50  Pulse: (!) 50 (!) 54  Resp: 13 16  Temp: (!) 36.2 C (!) 36.2 C  SpO2: 100% 97%    Last Pain:  Vitals:   07/10/19 0810  TempSrc:   PainSc: 7                  [name]

## 2019-07-11 LAB — SURGICAL PATHOLOGY

## 2019-07-18 DIAGNOSIS — M79676 Pain in unspecified toe(s): Secondary | ICD-10-CM

## 2019-07-31 DIAGNOSIS — M79676 Pain in unspecified toe(s): Secondary | ICD-10-CM

## 2019-09-26 DIAGNOSIS — M7702 Medial epicondylitis, left elbow: Secondary | ICD-10-CM | POA: Insufficient documentation

## 2019-10-30 ENCOUNTER — Telehealth: Payer: Self-pay | Admitting: *Deleted

## 2019-10-30 NOTE — Telephone Encounter (Signed)
I'm calling in regards to patient, Stephanie Lyons.  She's been seen by Dr. Logan Bores.  I want to clarify some work restrictions that Dr. Logan Bores had given for the patient.  This is just a follow-up call.  I did send a fax in yesterday with my questions.  Please feel free to respond by fax if that's more convenient for you."

## 2019-11-10 ENCOUNTER — Telehealth: Payer: Self-pay

## 2019-11-10 ENCOUNTER — Other Ambulatory Visit: Payer: Self-pay

## 2019-11-10 DIAGNOSIS — B001 Herpesviral vesicular dermatitis: Secondary | ICD-10-CM

## 2019-11-10 MED ORDER — VALACYCLOVIR HCL 1 G PO TABS: 1000.0000 mg | ORAL_TABLET | Freq: Every day | ORAL | 1 refills | Status: DC

## 2019-11-10 NOTE — Telephone Encounter (Signed)
Pt has an appt for TOC with Rennie Plowman, NP for her next available on 12/15/19. Pt needs a refill on her valACYclovir (VALTREX) 1000 MG tablet before that date. Pt has nine pills left and is needing them asap-Please call pt

## 2019-11-10 NOTE — Telephone Encounter (Signed)
Refilled & pt aware to keep appt for 12/6.

## 2019-11-18 NOTE — Telephone Encounter (Signed)
She didn't leave a call back number and I didn't receive a fax.

## 2019-12-11 ENCOUNTER — Other Ambulatory Visit: Payer: Self-pay

## 2019-12-15 ENCOUNTER — Encounter: Payer: Self-pay | Admitting: Family

## 2019-12-15 ENCOUNTER — Ambulatory Visit: Payer: Commercial Managed Care - PPO | Admitting: Family

## 2019-12-15 ENCOUNTER — Other Ambulatory Visit: Payer: Self-pay

## 2019-12-15 VITALS — BP 85/58 | HR 90 | Temp 98.2°F | Ht 62.0 in | Wt 115.6 lb

## 2019-12-15 DIAGNOSIS — R634 Abnormal weight loss: Secondary | ICD-10-CM

## 2019-12-15 DIAGNOSIS — Z7689 Persons encountering health services in other specified circumstances: Secondary | ICD-10-CM | POA: Diagnosis not present

## 2019-12-15 DIAGNOSIS — F32A Depression, unspecified: Secondary | ICD-10-CM | POA: Diagnosis not present

## 2019-12-15 DIAGNOSIS — F419 Anxiety disorder, unspecified: Secondary | ICD-10-CM

## 2019-12-15 NOTE — Progress Notes (Signed)
Subjective:    Patient ID: Stephanie Lyons, female    DOB: 23-Mar-1980, 39 y.o.   MRN: [id number]  CC: Stephanie Lyons is a 39 y.o. female who presents today to establish care.    HPI: Feels well today.  Here to Transfer care.   She was 183 lbs , intentional weight loss through dietary changes.   Eating 1200 calories per day initially however now on maintenance 1336/day as she wants to maintain weight.   Decreased sugar intake. No anorexia, bulimia. Working on [employer] and works out daily. No cp, sob, dizziness.Drinks plenty of water.   Mother has hematochromatosis and she would like to be evaluated for this.    Works as Production designer, theatre/television/film at [employer].Increased anxiety as worried about ability to work physical job.  Sober for 11 years; h/o cocaine , klonopin, marijuana use. No further alcohol use.   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   Struggled with depression and anxiety in the past .Has fiance. Best relationship she has been in in a long time. Trouble staying asleep. Supportive mother and fiance. Exercise is helpful for anxiety.  Never had attempt of SI. No si/hi.      HISTORY:  Past Medical History:  Diagnosis Date  . Arthritis   . Asthma   . Carpal tunnel syndrome, right   . Chicken pox   . Complication of anesthesia   . Depression   . Family history of adverse reaction to anesthesia    brother becomes aggressive  . GERD (gastroesophageal reflux disease)    rare  . Neck pain   . PONV (postoperative nausea and vomiting)    PT STATES AS LONG AS ANTIEMETIC GIVEN THRU IV SHE DOES WELL AND DOES NOT GET SICK-HAS NEVER HAD SCOPALAMINE PATCH   Past Surgical History:  Procedure Laterality Date  . CARPAL TUNNEL RELEASE Right 04/02/2015   Procedure: OPEN CARPAL TUNNEL RELEASE;  Surgeon: Erin Sons, MD;  Location: [facility];  Service: Orthopedics;  Laterality: Right;  . CARPAL TUNNEL RELEASE  Left 07/09/2019   Procedure: CARPAL TUNNEL RELEASE ENDOSCOPIC WITH EXCISION OF LEFT DORAL CARPAL GANGLION;  Surgeon: Christena Flake, MD;  Location: [facility];  Service: Orthopedics;  Laterality: Left;  . FOOT SURGERY Right   . HAND SURGERY     R- hand 2019, L-hand (2x) 2019  . left foot surgery    . SHOULDER SURGERY    . TONSILLECTOMY     Family History  Problem Relation Age of Onset  . Hypertension Mother   . Hemochromatosis Mother   . Hypertension Maternal Grandmother   . Hypertension Maternal Grandfather     Allergies: Dust mite extract, Morphine and related, Percocet [oxycodone-acetaminophen], Prednisone, and Kiwi extract Current Outpatient Medications on File Prior to Visit  Medication Sig Dispense Refill  . albuterol (VENTOLIN HFA) 108 (90 Base) MCG/ACT inhaler Inhale 2 puffs into the lungs every 6 (six) hours as needed for wheezing or shortness of breath. 18 g 3  . APPLE CIDER VINEGAR PO Take 2 capsules by mouth in the morning and at bedtime.    Marland Kitchen ibuprofen (ADVIL) 200 MG tablet Take 200 mg by mouth daily as needed for headache or moderate pain.     . Multiple Vitamins-Minerals (MULTIVITAMIN WITH MINERALS) tablet Take 1 tablet by mouth daily.    . valACYclovir (VALTREX) 1000 MG tablet Take 1 tablet (1,000 mg total) by mouth daily. 30 tablet 1  No current facility-administered medications on file prior to visit.    Social History   Tobacco Use  . Smoking status: Current Every Day Smoker    Packs/day: 0.30    Years: 5.00    Pack years: 1.50    Types: Cigarettes  . Smokeless tobacco: Never Used  Vaping Use  . Vaping Use: Never used  Substance Use Topics  . Alcohol use: Not Currently    Alcohol/week: 8.0 standard drinks    Types: 4 Glasses of wine, 4 Cans of beer per week  . Drug use: Never    Review of Systems  Constitutional: Negative for chills and fever.  Respiratory: Negative for cough.   Cardiovascular: Negative for chest pain and palpitations.    Gastrointestinal: Negative for nausea and vomiting.  Neurological: Negative for dizziness.  Psychiatric/Behavioral: Positive for sleep disturbance. Negative for suicidal ideas. The patient is nervous/anxious.       Objective:    BP (!) 85/58   Pulse 90   Temp 98.2 F (36.8 C)   Ht 5\' 2"  (1.575 m)   Wt 115 lb 9.6 oz (52.4 kg)   SpO2 99%   BMI 21.14 kg/m  BP Readings from Last 3 Encounters:  12/15/19 (!) 85/58  07/09/19 (!) 91/50  12/03/18 133/86   Wt Readings from Last 3 Encounters:  12/15/19 115 lb 9.6 oz (52.4 kg)  07/09/19 113 lb (51.3 kg)  10/30/18 138 lb (62.6 kg)    Physical Exam Vitals reviewed.  Constitutional:      Appearance: She is well-developed.  Eyes:     Conjunctiva/sclera: Conjunctivae normal.  Cardiovascular:     Rate and Rhythm: Normal rate and regular rhythm.     Pulses: Normal pulses.     Heart sounds: Normal heart sounds.  Pulmonary:     Effort: Pulmonary effort is normal.     Breath sounds: Normal breath sounds. No wheezing, rhonchi or rales.  Skin:    General: Skin is warm and dry.  Neurological:     Mental Status: She is alert.  Psychiatric:        Speech: Speech normal.        Behavior: Behavior normal.        Thought Content: Thought content normal.        Assessment & Plan:   Problem List Items Addressed This Visit      Other   Anxiety and depression    Stable. Great relationship and patient has positive coping skills. No substance use. She declines medication or counseling at this time. We will continue to follow.       Encounter to establish care - Primary   Relevant Orders   CBC with Differential/Platelet (Completed)   IBC + Ferritin (Completed)   Ambulatory referral to Hematology   Comprehensive metabolic panel (Completed)   Weight loss    Exceptional, intentional weight loss and now in maintenance phase. Blood pressure low end which I suspect related to weight loss. she is asymptomatic. Will monitor BP and weight.  Advised no further weight loss.           I am having Stephanie Lyons "Stephanie Lyons" maintain her albuterol, multivitamin with minerals, ibuprofen, APPLE CIDER VINEGAR PO, and valACYclovir.   No orders of the defined types were placed in this encounter.   Return precautions given.   Risks, benefits, and alternatives of the medications and treatment plan prescribed today were discussed, and patient expressed understanding.   Education regarding symptom management and diagnosis given  to patient on AVS.  Continue to follow with Allegra Grana, FNP for routine health maintenance.   Stephanie Lyons and I agreed with plan.   Rennie Plowman, FNP

## 2019-12-15 NOTE — Patient Instructions (Signed)
Nice to meet you! Referral to hematology Let us know if you dont hear back within a week in regards to an appointment being scheduled.

## 2019-12-16 ENCOUNTER — Encounter: Payer: Self-pay | Admitting: Family

## 2019-12-16 DIAGNOSIS — F32A Depression, unspecified: Secondary | ICD-10-CM | POA: Insufficient documentation

## 2019-12-16 DIAGNOSIS — R634 Abnormal weight loss: Secondary | ICD-10-CM | POA: Insufficient documentation

## 2019-12-16 LAB — CBC WITH DIFFERENTIAL/PLATELET
Basophils Absolute: 0.1 10*3/uL (ref 0.0–0.1)
Basophils Relative: 0.7 % (ref 0.0–3.0)
Eosinophils Absolute: 0.1 10*3/uL (ref 0.0–0.7)
Eosinophils Relative: 1.4 % (ref 0.0–5.0)
HCT: 44.2 % (ref 36.0–46.0)
Hemoglobin: 14.8 g/dL (ref 12.0–15.0)
Lymphocytes Relative: 27.4 % (ref 12.0–46.0)
Lymphs Abs: 2.8 10*3/uL (ref 0.7–4.0)
MCHC: 33.5 g/dL (ref 30.0–36.0)
MCV: 99.7 fl (ref 78.0–100.0)
Monocytes Absolute: 0.7 10*3/uL (ref 0.1–1.0)
Monocytes Relative: 7.2 % (ref 3.0–12.0)
Neutro Abs: 6.5 10*3/uL (ref 1.4–7.7)
Neutrophils Relative %: 63.3 % (ref 43.0–77.0)
Platelets: 313 10*3/uL (ref 150.0–400.0)
RBC: 4.43 Mil/uL (ref 3.87–5.11)
RDW: 13.1 % (ref 11.5–15.5)
WBC: 10.3 10*3/uL (ref 4.0–10.5)

## 2019-12-16 LAB — COMPREHENSIVE METABOLIC PANEL
ALT: 9 U/L (ref 0–35)
AST: 11 U/L (ref 0–37)
Albumin: 4.5 g/dL (ref 3.5–5.2)
Alkaline Phosphatase: 43 U/L (ref 39–117)
BUN: 22 mg/dL (ref 6–23)
CO2: 28 mEq/L (ref 19–32)
Calcium: 9.5 mg/dL (ref 8.4–10.5)
Chloride: 103 mEq/L (ref 96–112)
Creatinine, Ser: 0.62 mg/dL (ref 0.40–1.20)
GFR: 113.38 mL/min (ref >60.00)
Glucose, Bld: 77 mg/dL (ref 70–99)
Potassium: 4.3 mEq/L (ref 3.5–5.1)
Sodium: 137 mEq/L (ref 135–145)
Total Bilirubin: 0.3 mg/dL (ref 0.2–1.2)
Total Protein: 7.2 g/dL (ref 6.0–8.3)

## 2019-12-16 LAB — IBC + FERRITIN
Ferritin: 83.6 ng/mL (ref 10.0–291.0)
Iron: 53 ug/dL (ref 42–145)
Saturation Ratios: 17.4 % — ABNORMAL LOW (ref 20.0–50.0)
Transferrin: 217 mg/dL (ref 212.0–360.0)

## 2019-12-16 NOTE — Assessment & Plan Note (Signed)
Stable. Great relationship and patient has positive coping skills. No substance use. She declines medication or counseling at this time. We will continue to follow.

## 2019-12-16 NOTE — Assessment & Plan Note (Signed)
Exceptional, intentional weight loss and now in maintenance phase. Blood pressure low end which I suspect related to weight loss. she is asymptomatic. Will monitor BP and weight. Advised no further weight loss.

## 2019-12-17 ENCOUNTER — Other Ambulatory Visit: Payer: Self-pay

## 2019-12-17 DIAGNOSIS — B001 Herpesviral vesicular dermatitis: Secondary | ICD-10-CM

## 2019-12-17 MED ORDER — VALACYCLOVIR HCL 1 G PO TABS: 1000.0000 mg | ORAL_TABLET | Freq: Every day | ORAL | 1 refills | Status: DC

## 2019-12-22 ENCOUNTER — Encounter: Payer: Self-pay | Admitting: Family

## 2019-12-26 ENCOUNTER — Telehealth: Payer: Self-pay | Admitting: *Deleted

## 2019-12-26 NOTE — Telephone Encounter (Signed)
"  I'm calling to check the status of a medical records request.  The request was sent on December 7.  Case # is [id number]

## 2019-12-26 NOTE — Telephone Encounter (Signed)
No records found for 10/10/19 to present, request and response has been faxed back.

## 2020-01-10 ENCOUNTER — Encounter: Payer: Self-pay | Admitting: Family

## 2020-01-12 ENCOUNTER — Encounter: Payer: Self-pay | Admitting: Family

## 2020-01-12 ENCOUNTER — Telehealth (INDEPENDENT_AMBULATORY_CARE_PROVIDER_SITE_OTHER): Payer: Commercial Managed Care - PPO | Admitting: Family

## 2020-01-12 ENCOUNTER — Inpatient Hospital Stay: Payer: Commercial Managed Care - PPO | Attending: Oncology | Admitting: Oncology

## 2020-01-12 ENCOUNTER — Other Ambulatory Visit: Payer: Commercial Managed Care - PPO

## 2020-01-12 ENCOUNTER — Inpatient Hospital Stay: Payer: Commercial Managed Care - PPO

## 2020-01-12 ENCOUNTER — Other Ambulatory Visit: Payer: Self-pay

## 2020-01-12 ENCOUNTER — Telehealth: Payer: Self-pay | Admitting: Family

## 2020-01-12 VITALS — Temp 97.5°F | Ht 62.0 in | Wt 115.0 lb

## 2020-01-12 DIAGNOSIS — R059 Cough, unspecified: Secondary | ICD-10-CM | POA: Diagnosis not present

## 2020-01-12 DIAGNOSIS — U071 COVID-19: Secondary | ICD-10-CM | POA: Diagnosis not present

## 2020-01-12 LAB — POC INFLUENZA A&B (BINAX/QUICKVUE)
Influenza A, POC: NEGATIVE
Influenza B, POC: NEGATIVE

## 2020-01-12 NOTE — Progress Notes (Unsigned)
Virtual Visit via Video Note  I connected with@  on 01/13/20 at 10:30 AM EST by a video enabled telemedicine application and verified that I am speaking with the correct person using two identifiers.  Location patient: home Location provider:work  Persons participating in the virtual visit: patient, provider  I discussed the limitations of evaluation and management by telemedicine and the availability of in person appointments. The patient expressed understanding and agreed to proceed.   HPI:  CC: sporadic productive cough x 2 days,improved.  Fever resolved. 2 days ago tmax 101.93F. No sob, wheezing, rattle in chest, chest tightness.  Continues to bodyaches, mild HA.   She is not covid vaccinated  Taking mucinex D, zinc with relief.  Smoker H/o asthma when 'had bad allergies'. Doesn't feel she needs an inhaler at this time.  At  Home, quarantined.  Home covid test is positive yesterday.    ROS: See pertinent positives and negatives per HPI.    EXAM:  VITALS per patient if applicable: Temp (!) 97.5 F (36.4 C)   Ht 5\' 2"  (1.575 m)   Wt 115 lb (52.2 kg)   BMI 21.03 kg/m  BP Readings from Last 3 Encounters:  12/15/19 (!) 85/58  07/09/19 (!) 91/50  12/03/18 133/86   Wt Readings from Last 3 Encounters:  01/12/20 115 lb (52.2 kg)  12/15/19 115 lb 9.6 oz (52.4 kg)  07/09/19 113 lb (51.3 kg)    GENERAL: alert, oriented, appears well and in no acute distress  HEENT: atraumatic, conjunttiva clear, no obvious abnormalities on inspection of external nose and ears  NECK: normal movements of the head and neck  LUNGS: on inspection no signs of respiratory distress, breathing rate appears normal, no obvious gross SOB, gasping or wheezing  CV: no obvious cyanosis  MS: moves all visible extremities without noticeable abnormality  PSYCH/NEURO: pleasant and cooperative, no obvious depression or anxiety, speech and thought processing grossly intact  ASSESSMENT AND  PLAN:  Discussed the following assessment and plan:  Problem List Items Addressed This Visit      Other   COVID-19    Fever resolved. No acute respiratory distress. Sarah ( CMA) has left message at [facility] infusion center however they are not accepted referrals at this time. Patient would qualify due to immunization status however with mild symptoms, I advised patient to optimize vitamins and continue conservative management. She will let me know how she is doing.       Other Visit Diagnoses    Cough    -  Primary   Relevant Orders   Novel Coronavirus, NAA ([facility])   POCT Influenza A/B      -we discussed possible serious and likely etiologies, options for evaluation and workup, limitations of telemedicine visit vs in person visit, treatment, treatment risks and precautions. Pt prefers to treat via telemedicine empirically rather then risking or undertaking an in person visit at this moment.  .   I discussed the assessment and treatment plan with the patient. The patient was provided an opportunity to ask questions and all were answered. The patient agreed with the plan and demonstrated an understanding of the instructions.   The patient was advised to call back or seek an in-person evaluation if the symptoms worsen or if the condition fails to improve as anticipated.   07/11/19, FNP

## 2020-01-12 NOTE — Telephone Encounter (Signed)
I have called [place] & spoke with [facility]. They will be teaching out to patient to get her scheduled. I called [facility] hotline & they have a recording to call back 01/12/20 after 8am to refer patients. It appears that recording has not been taken off & phone line open.

## 2020-01-12 NOTE — Patient Instructions (Addendum)
Covid test [facility].   We have referred you for Monoclonal antibodies however as discussed due to low supply [facility] is using algorithm of  Highest risk patients to determine eligibility in particular patients who are not covid vaccinated. You may receive a phone call regarding this. You must receive this infusion within SEVEN days of symptom onset. If you do not receive call, and in particular have worsening symptoms, please let me know right away.   Purchase pulse oximeter to monitor oxygenation saturation. If you oxygen were to drop < 90%, this is an emergency and you may need oxygen support. You would need to seek in person evaluation at the emergency room or call 911.   Please start Mucinex DM to take at bedtime for cough.     Please begin Vit C 1000 mg daily   Vitamin D3 4000 IU daily   Zinc 50 mg daily   Quercetin 250 mg -500 mg twice daily ; this an antioxidant which reduces inflammation. You can find it at places such as [employer] and [employer] however not limited to these stores.   Once you are better, you may STOP all the above supplements.   Rest, hydrate very well, eat healthy protein food, Tylenol or Advil as directed .    Please go to Acute Care if symptoms worsen but are not an emergency. Office video visit again early next week.    Remain in self quarantine until at least 10 days since symptom onset AND 3 consecutive days fever free without antipyretics AND improvement in respiratory symptoms.    Utilize over the counter medications to treat symptoms.   Seek  treatment in the ED if respiratory issues/distress develops or unrelieved chest pain. Or, if you become severely weak, dehydrated, confused.    Only leave home to seek medical care and must wear a mask in public. Limit contact with family members or caregivers in the home and to notify her family who she was with yesterday that they should quarantine for 14 days. Practice social distancing and to continue to use good  preventative care measures such has frequent hand washing.      Your COVID-19 test was resulted as positive.    You should continue your self imposed quantarine until you can answer "yes" to ALL 3 of the conditions below:   1) Your symptoms (fever, cough, shortness of breath) started 7 or more days ago   2) Your body temperature  has been normal for at least 72 hours (WITHOUT the use of any tylenol, motrin or aleve) . Normal is < 100.4 Farenheit  3) your other flu  like symptoms symptoms are getting better.   YOUR FAMILY or other household contacts, however , even if they feel fine , will need to continue to quarantining themselves  (that means no contact with ANYONE outside of the house)  for 14 days STARTING from the end of your initial 7 day period . (why? because they have been theoretically exposed to you during the entire 7 days of your illness, and they can sheD the virus without symptoms for that period of time ).         10 Things You Can Do to Manage Your COVID-19 Symptoms at Home If you have possible or confirmed COVID-19: 1. Stay home from work and school. And stay away from other public places. If you must go out, avoid using any kind of public transportation, ridesharing, or taxis. 2. Monitor your symptoms carefully. If  your symptoms get worse, call your healthcare provider immediately. 3. Get rest and stay hydrated. 4. If you have a medical appointment, call the healthcare provider ahead of time and tell them that you have or may have COVID-19. 5. For medical emergencies, call 911 and notify the dispatch personnel that you have or may have COVID-19. 6. Cover your cough and sneezes with a tissue or use the inside of your elbow. 7. Wash your hands often with soap and water for at least 20 seconds or clean your hands with an alcohol-based hand sanitizer that contains at least 60% alcohol. 8. As much as possible, stay in a specific room and away from other people in your home.  Also, you should use a separate bathroom, if available. If you need to be around other people in or outside of the home, wear a mask. 9. Avoid sharing personal items with other people in your household, like dishes, towels, and bedding. 10. Clean all surfaces that are touched often, like counters, tabletops, and doorknobs. Use household cleaning sprays or wipes according to the label instructions. [email or web address] 07/10/2018

## 2020-01-12 NOTE — Telephone Encounter (Signed)
Please call and  Leave message for [facility] infusion Please ensure pt is scheduled for covid test at [facility]

## 2020-01-13 ENCOUNTER — Other Ambulatory Visit: Payer: Self-pay | Admitting: Family

## 2020-01-13 ENCOUNTER — Other Ambulatory Visit: Payer: Commercial Managed Care - PPO

## 2020-01-13 DIAGNOSIS — U071 COVID-19: Secondary | ICD-10-CM | POA: Insufficient documentation

## 2020-01-13 DIAGNOSIS — B001 Herpesviral vesicular dermatitis: Secondary | ICD-10-CM

## 2020-01-13 NOTE — Assessment & Plan Note (Signed)
Fever resolved. No acute respiratory distress. Sarah ( CMA) has left message at [facility] infusion center however they are not accepted referrals at this time. Patient would qualify due to immunization status however with mild symptoms, I advised patient to optimize vitamins and continue conservative management. She will let me know how she is doing.

## 2020-01-14 ENCOUNTER — Encounter: Payer: Self-pay | Admitting: Family

## 2020-01-14 LAB — NOVEL CORONAVIRUS, NAA: SARS-CoV-2, NAA: DETECTED — AB

## 2020-01-14 LAB — SARS-COV-2, NAA 2 DAY TAT

## 2020-01-15 ENCOUNTER — Encounter: Payer: Self-pay | Admitting: Family

## 2020-01-28 ENCOUNTER — Other Ambulatory Visit: Payer: Self-pay

## 2020-01-28 ENCOUNTER — Encounter: Payer: Self-pay | Admitting: Family

## 2020-01-28 ENCOUNTER — Ambulatory Visit: Payer: Commercial Managed Care - PPO | Admitting: Family

## 2020-01-28 VITALS — BP 108/60 | HR 55 | Temp 97.8°F | Ht 62.01 in | Wt 118.6 lb

## 2020-01-28 DIAGNOSIS — H9201 Otalgia, right ear: Secondary | ICD-10-CM | POA: Diagnosis not present

## 2020-01-28 MED ORDER — AZELASTINE HCL 0.1 % NA SOLN: 1.0000 | Freq: Two times a day (BID) | NASAL | 4 refills | Status: DC

## 2020-01-28 NOTE — Patient Instructions (Signed)
Trial of Azelastin Let me know if not better as I would recommend ENT referral

## 2020-01-28 NOTE — Progress Notes (Unsigned)
Subjective:    Patient ID: Stephanie Lyons, female    DOB: 02-23-1980, 40 y.o.   MRN: [id number]  CC: Stephanie Lyons is a 40 y.o. female who presents today for follow up.   HPI: Had covid 3 weeks ago. Fatigue and cough resolved. Has resumed exercise and back at work.   Quit smoking 2 weeks ago.  Right ear pain for several weeks even prior to covid diagnoses 3 weeks ago. Comes and goes.Ear is itching.  Very little congestion,scant clear and runny which she resembles her seasonal allergies.  Hearing muffled. No ha, cough, fever, vision changes.   Has seen ENT years ago.   H/o tonsilectomy, adenoidectomy      HISTORY:  Past Medical History:  Diagnosis Date  . Arthritis   . Asthma   . Carpal tunnel syndrome, right   . Chicken pox   . Complication of anesthesia   . Depression   . Family history of adverse reaction to anesthesia    brother becomes aggressive  . GERD (gastroesophageal reflux disease)    rare  . Neck pain   . PONV (postoperative nausea and vomiting)    PT STATES AS LONG AS ANTIEMETIC GIVEN THRU IV SHE DOES WELL AND DOES NOT GET SICK-HAS NEVER HAD SCOPALAMINE PATCH   Past Surgical History:  Procedure Laterality Date  . CARPAL TUNNEL RELEASE Right 04/02/2015   Procedure: OPEN CARPAL TUNNEL RELEASE;  Surgeon: Erin Sons, MD;  Location: [facility];  Service: Orthopedics;  Laterality: Right;  . CARPAL TUNNEL RELEASE Left 07/09/2019   Procedure: CARPAL TUNNEL RELEASE ENDOSCOPIC WITH EXCISION OF LEFT DORAL CARPAL GANGLION;  Surgeon: Christena Flake, MD;  Location: [facility];  Service: Orthopedics;  Laterality: Left;  . FOOT SURGERY Right   . HAND SURGERY     R- hand 2019, L-hand (2x) 2019  . left foot surgery    . SHOULDER SURGERY    . TONSILLECTOMY  2012   Family History  Problem Relation Age of Onset  . Hypertension Mother   . Hemochromatosis Mother   . Hypertension Maternal Grandmother   . Hypertension Maternal Grandfather     Allergies:  Dust mite extract, Morphine and related, Percocet [oxycodone-acetaminophen], Prednisone, and Kiwi extract Current Outpatient Medications on File Prior to Visit  Medication Sig Dispense Refill  . albuterol (VENTOLIN HFA) 108 (90 Base) MCG/ACT inhaler Inhale 2 puffs into the lungs every 6 (six) hours as needed for wheezing or shortness of breath. 18 g 3  . APPLE CIDER VINEGAR PO Take 2 capsules by mouth in the morning and at bedtime.    . cholecalciferol (VITAMIN D3) 25 MCG (1000 UNIT) tablet Take 1,000 Units by mouth daily.    Marland Kitchen ibuprofen (ADVIL) 200 MG tablet Take 200 mg by mouth daily as needed for headache or moderate pain.     . Multiple Vitamins-Minerals (MULTIVITAMIN WITH MINERALS) tablet Take 1 tablet by mouth daily. Takes two gummies every morning.    . valACYclovir (VALTREX) 1000 MG tablet TAKE 1 TABLET BY MOUTH EVERY DAY 30 tablet 1  . zinc gluconate 50 MG tablet Take 50 mg by mouth daily.     No current facility-administered medications on file prior to visit.    Social History   Tobacco Use  . Smoking status: Former Smoker    Packs/day: 0.30    Years: 5.00    Pack years: 1.50    Types: Cigarettes    Quit date: 01/14/2020    Years since quitting: 0.0  .  Smokeless tobacco: Never Used  Vaping Use  . Vaping Use: Never used  Substance Use Topics  . Alcohol use: Not Currently    Alcohol/week: 8.0 standard drinks    Types: 4 Glasses of wine, 4 Cans of beer per week  . Drug use: Never    Review of Systems  Constitutional: Negative for chills and fever.  HENT: Positive for congestion and ear pain. Negative for ear discharge.   Respiratory: Negative for cough.   Cardiovascular: Negative for chest pain and palpitations.  Gastrointestinal: Negative for nausea and vomiting.      Objective:    BP 108/60 (BP Location: Right Arm, Patient Position: Sitting)   Pulse (!) 55   Temp 97.8 F (36.6 C)   Ht 5' 2.01" (1.575 m)   Wt 118 lb 9.6 oz (53.8 kg)   SpO2 99%   BMI 21.69  kg/m  BP Readings from Last 3 Encounters:  01/28/20 108/60  12/15/19 (!) 85/58  07/09/19 (!) 91/50   Wt Readings from Last 3 Encounters:  01/28/20 118 lb 9.6 oz (53.8 kg)  01/12/20 115 lb (52.2 kg)  12/15/19 115 lb 9.6 oz (52.4 kg)    Physical Exam Vitals reviewed.  Constitutional:      Appearance: She is well-developed and well-nourished.  HENT:     Head: Normocephalic and atraumatic.     Right Ear: Hearing, tympanic membrane, ear canal and external ear normal. No decreased hearing noted. No drainage, swelling or tenderness. No middle ear effusion. No foreign body. Tympanic membrane is not erythematous or bulging.     Left Ear: Hearing, tympanic membrane, ear canal and external ear normal. No decreased hearing noted. No drainage, swelling or tenderness.  No middle ear effusion. No foreign body. Tympanic membrane is not erythematous or bulging.     Nose: Nose normal. No rhinorrhea.     Right Sinus: No maxillary sinus tenderness or frontal sinus tenderness.     Left Sinus: No maxillary sinus tenderness or frontal sinus tenderness.     Mouth/Throat:     Mouth: Oropharynx is clear and moist and mucous membranes are normal.     Pharynx: Uvula midline. No oropharyngeal exudate, posterior oropharyngeal edema or posterior oropharyngeal erythema.     Tonsils: No tonsillar abscesses.  Eyes:     Conjunctiva/sclera: Conjunctivae normal.  Cardiovascular:     Rate and Rhythm: Regular rhythm.     Pulses: Normal pulses.     Heart sounds: Normal heart sounds.  Pulmonary:     Effort: Pulmonary effort is normal.     Breath sounds: Normal breath sounds. No wheezing, rhonchi or rales.  Lymphadenopathy:     Head:     Right side of head: No submental, submandibular, tonsillar, preauricular, posterior auricular or occipital adenopathy.     Left side of head: No submental, submandibular, tonsillar, preauricular, posterior auricular or occipital adenopathy.     Cervical: No cervical adenopathy.   Skin:    General: Skin is warm and dry.  Neurological:     Mental Status: She is alert.  Psychiatric:        Mood and Affect: Mood and affect normal.        Speech: Speech normal.        Behavior: Behavior normal.        Thought Content: Thought content normal.        Assessment & Plan:   Problem List Items Addressed This Visit      Other   Right ear  pain - Primary    Benign exam. Presentation consistent with eustachian tube dysfunction. Allergic to prednisone. We opted to trial Azelastine. If no resolve, we will consult ENT.       Relevant Medications   azelastine (ASTELIN) 0.1 % nasal spray       I am having Darionna Brager "Mo" start on azelastine. I am also having her maintain her albuterol, multivitamin with minerals, ibuprofen, APPLE CIDER VINEGAR PO, zinc gluconate, cholecalciferol, and valACYclovir.   Meds ordered this encounter  Medications  . azelastine (ASTELIN) 0.1 % nasal spray    Sig: Place 1 spray into both nostrils 2 (two) times daily. Use in each nostril as directed    Dispense:  1 mL    Refill:  4    Order Specific Question:   Supervising Provider    Answer:   Sherlene Shams [[id number]]    Return precautions given.   Risks, benefits, and alternatives of the medications and treatment plan prescribed today were discussed, and patient expressed understanding.   Education regarding symptom management and diagnosis given to patient on AVS.  Continue to follow with Allegra Grana, FNP for routine health maintenance.   Stephanie Lyons and I agreed with plan.   Rennie Plowman, FNP

## 2020-01-29 ENCOUNTER — Encounter: Payer: Self-pay | Admitting: Family

## 2020-01-29 NOTE — Assessment & Plan Note (Signed)
Benign exam. Presentation consistent with eustachian tube dysfunction. Allergic to prednisone. We opted to trial Azelastine. If no resolve, we will consult ENT.

## 2020-02-07 ENCOUNTER — Other Ambulatory Visit: Payer: Self-pay | Admitting: Family

## 2020-02-07 DIAGNOSIS — B001 Herpesviral vesicular dermatitis: Secondary | ICD-10-CM

## 2020-02-26 ENCOUNTER — Encounter: Payer: Self-pay | Admitting: Family

## 2020-02-27 ENCOUNTER — Other Ambulatory Visit: Payer: Self-pay | Admitting: Family

## 2020-02-27 DIAGNOSIS — H9201 Otalgia, right ear: Secondary | ICD-10-CM

## 2020-03-03 ENCOUNTER — Other Ambulatory Visit: Payer: Self-pay | Admitting: Family

## 2020-03-03 DIAGNOSIS — B001 Herpesviral vesicular dermatitis: Secondary | ICD-10-CM

## 2020-03-27 ENCOUNTER — Other Ambulatory Visit: Payer: Self-pay | Admitting: Family

## 2020-03-27 DIAGNOSIS — B001 Herpesviral vesicular dermatitis: Secondary | ICD-10-CM

## 2020-04-26 ENCOUNTER — Other Ambulatory Visit: Payer: Self-pay | Admitting: Family

## 2020-04-26 DIAGNOSIS — B001 Herpesviral vesicular dermatitis: Secondary | ICD-10-CM

## 2020-05-25 ENCOUNTER — Other Ambulatory Visit: Payer: Self-pay | Admitting: Family

## 2020-05-25 DIAGNOSIS — B001 Herpesviral vesicular dermatitis: Secondary | ICD-10-CM

## 2020-06-20 ENCOUNTER — Other Ambulatory Visit: Payer: Self-pay | Admitting: Family

## 2020-06-20 DIAGNOSIS — B001 Herpesviral vesicular dermatitis: Secondary | ICD-10-CM

## 2020-06-24 ENCOUNTER — Ambulatory Visit (INDEPENDENT_AMBULATORY_CARE_PROVIDER_SITE_OTHER): Payer: Commercial Managed Care - PPO

## 2020-06-24 ENCOUNTER — Ambulatory Visit: Payer: Commercial Managed Care - PPO | Admitting: Podiatry

## 2020-06-24 ENCOUNTER — Encounter: Payer: Self-pay | Admitting: Podiatry

## 2020-06-24 ENCOUNTER — Other Ambulatory Visit: Payer: Self-pay

## 2020-06-24 DIAGNOSIS — S93601A Unspecified sprain of right foot, initial encounter: Secondary | ICD-10-CM

## 2020-06-24 DIAGNOSIS — S99921A Unspecified injury of right foot, initial encounter: Secondary | ICD-10-CM | POA: Diagnosis not present

## 2020-06-29 ENCOUNTER — Other Ambulatory Visit: Payer: Self-pay | Admitting: Podiatry

## 2020-06-29 ENCOUNTER — Encounter: Payer: Self-pay | Admitting: Podiatry

## 2020-06-29 DIAGNOSIS — S93601A Unspecified sprain of right foot, initial encounter: Secondary | ICD-10-CM

## 2020-06-29 NOTE — Progress Notes (Signed)
Subjective:  Patient ID: Stephanie Lyons, female    DOB: 1980-04-24,  MRN: [id number]  Chief Complaint  Patient presents with   Toe Injury    "I fell yesterday and bent my right big toe under and since then it has been swelling and starting to bruise"    40 y.o. female presents with the above complaint.  Patient presents with complaint of right first MPJ hyperflexion injury.  Patient states that she fell yesterday and the toe bent under the foot.  She states been swollen bruising painful to walk.  Is tender to touch.  She is tried ibuprofen icing.  Pain scale is 8 out of 10 hurts with ambulation.  She has not seen MRIs prior to seeing me.  She denies any other acute complaints.  She would like to discuss treatment options for this.   Review of Systems: Negative except as noted in the HPI. Denies N/V/F/Ch.  Past Medical History:  Diagnosis Date   Arthritis    Asthma    Carpal tunnel syndrome, right    Chicken pox    Complication of anesthesia    Depression    Family history of adverse reaction to anesthesia    brother becomes aggressive   GERD (gastroesophageal reflux disease)    rare   Neck pain    PONV (postoperative nausea and vomiting)    PT STATES AS LONG AS ANTIEMETIC GIVEN THRU IV SHE DOES WELL AND DOES NOT GET SICK-HAS NEVER HAD SCOPALAMINE PATCH    Current Outpatient Medications:    albuterol (VENTOLIN HFA) 108 (90 Base) MCG/ACT inhaler, Inhale 2 puffs into the lungs every 6 (six) hours as needed for wheezing or shortness of breath., Disp: 18 g, Rfl: 3   APPLE CIDER VINEGAR PO, Take 2 capsules by mouth in the morning and at bedtime., Disp: , Rfl:    cholecalciferol (VITAMIN D3) 25 MCG (1000 UNIT) tablet, Take 1,000 Units by mouth daily., Disp: , Rfl:    ibuprofen (ADVIL) 200 MG tablet, Take 200 mg by mouth daily as needed for headache or moderate pain. , Disp: , Rfl:    Multiple Vitamins-Minerals (MULTIVITAMIN WITH MINERALS) tablet, Take 1 tablet by mouth daily. Takes  two gummies every morning., Disp: , Rfl:    valACYclovir (VALTREX) 1000 MG tablet, TAKE 1 TABLET BY MOUTH EVERY DAY, Disp: 30 tablet, Rfl: 1   zinc gluconate 50 MG tablet, Take 50 mg by mouth daily., Disp: , Rfl:   Social History   Tobacco Use  Smoking Status Former   Packs/day: 0.30   Years: 5.00   Pack years: 1.50   Types: Cigarettes   Quit date: 01/14/2020   Years since quitting: 0.4  Smokeless Tobacco Never    Allergies  Allergen Reactions   Dust Mite Extract Other (See Comments)    Allergy headache   Morphine And Related Other (See Comments)    Hypotension   Percocet [Oxycodone-Acetaminophen] Nausea And Vomiting   Prednisone Other (See Comments)    Give her bad thoughts   Kiwi Extract Hives   Objective:  There were no vitals filed for this visit. There is no height or weight on file to calculate BMI. Constitutional Well developed. Well nourished.  Vascular Dorsalis pedis pulses palpable bilaterally. Posterior tibial pulses palpable bilaterally. Capillary refill normal to all digits.  No cyanosis or clubbing noted. Pedal hair growth normal.  Neurologic Normal speech. Oriented to person, place, and time. Epicritic sensation to light touch grossly present bilaterally.  Dermatologic Nails well  groomed and normal in appearance. No open wounds. No skin lesions.  Orthopedic: Pain on palpation with range of motion of right first metatarsophalangeal joint.  No deep intra-articular pain noted.  No crepitus noted.  No pain at the sesamoidal complex noted.  Pain consistent with capsulitis.  No involvement of extensor or flexor tendinitis.  Noted   Radiographs: 3 views of skeletally mature adult right foot: No fractures noted.  No sesamoidal fracture noted.  No bony abnormalities identified.  No osteoarthritic changes noted. Assessment:   1. Injury of right great toe, initial encounter   2. Sprain of foot joint, right, initial encounter    Plan:  Patient was evaluated and  treated and all questions answered.  Right first MPJ hyperflexion injury/sprain -I explained the patient the etiology of sprain and various treatment options were discussed.  Given the amount of pain that she is having and not allowing to immobilize appropriately I believe patient will benefit from cam boot immobilization for 4 weeks.  I discussed this with the patient extensive detail she states understanding.  She will place her self back in the boot.  She denies any other acute complaints  No follow-ups on file.

## 2020-07-14 ENCOUNTER — Other Ambulatory Visit: Payer: Self-pay | Admitting: Family

## 2020-07-14 DIAGNOSIS — B001 Herpesviral vesicular dermatitis: Secondary | ICD-10-CM

## 2020-07-29 ENCOUNTER — Telehealth: Payer: Self-pay

## 2020-08-05 ENCOUNTER — Ambulatory Visit: Payer: Commercial Managed Care - PPO | Admitting: Podiatry

## 2020-08-14 ENCOUNTER — Other Ambulatory Visit: Payer: Self-pay | Admitting: Family

## 2020-08-14 DIAGNOSIS — B001 Herpesviral vesicular dermatitis: Secondary | ICD-10-CM

## 2020-09-01 ENCOUNTER — Ambulatory Visit: Payer: Commercial Managed Care - PPO | Admitting: Family

## 2020-09-01 ENCOUNTER — Encounter: Payer: Self-pay | Admitting: Family

## 2020-09-01 ENCOUNTER — Other Ambulatory Visit: Payer: Self-pay

## 2020-09-01 DIAGNOSIS — Z832 Family history of diseases of the blood and blood-forming organs and certain disorders involving the immune mechanism: Secondary | ICD-10-CM | POA: Insufficient documentation

## 2020-09-01 NOTE — Progress Notes (Signed)
Subjective:    Patient ID: Stephanie Lyons, female    DOB: [birth year], 39 y.o.   MRN: [id number]  CC: Stephanie Lyons is a 40 y.o. female who presents today for follow up.   HPI: She is here today for concerns of elevated iron. Her mother has  hemachromatosis and is receiving phlebotomy.  Mother asked her to come to have lab work done  She is exercising daily.  No shortness of breath, chest pain.     HISTORY:  Past Medical History:  Diagnosis Date   Arthritis    Asthma    Carpal tunnel syndrome, right    Chicken pox    Complication of anesthesia    Depression    Family history of adverse reaction to anesthesia    brother becomes aggressive   GERD (gastroesophageal reflux disease)    rare   Neck pain    PONV (postoperative nausea and vomiting)    PT STATES AS LONG AS ANTIEMETIC GIVEN THRU IV SHE DOES WELL AND DOES NOT GET SICK-HAS NEVER HAD SCOPALAMINE PATCH   Past Surgical History:  Procedure Laterality Date   CARPAL TUNNEL RELEASE Right 04/02/2015   Procedure: OPEN CARPAL TUNNEL RELEASE;  Surgeon: Erin Sons, MD;  Location: [facility];  Service: Orthopedics;  Laterality: Right;   CARPAL TUNNEL RELEASE Left 07/09/2019   Procedure: CARPAL TUNNEL RELEASE ENDOSCOPIC WITH EXCISION OF LEFT DORAL CARPAL GANGLION;  Surgeon: Christena Flake, MD;  Location: [facility];  Service: Orthopedics;  Laterality: Left;   FOOT SURGERY Right    HAND SURGERY     R- hand 2019, L-hand (2x) 2019   left foot surgery     SHOULDER SURGERY     TONSILLECTOMY  2012   Family History  Problem Relation Age of Onset   Hypertension Mother    Hemochromatosis Mother    Hypertension Maternal Grandmother    Hypertension Maternal Grandfather     Allergies: Dust mite extract, Morphine and related, Percocet [oxycodone-acetaminophen], Prednisone, and Kiwi extract Current Outpatient Medications on File Prior to Visit  Medication Sig Dispense Refill   albuterol (VENTOLIN HFA) 108 (90 Base)  MCG/ACT inhaler Inhale 2 puffs into the lungs every 6 (six) hours as needed for wheezing or shortness of breath. 18 g 3   APPLE CIDER VINEGAR PO Take 2 capsules by mouth in the morning and at bedtime.     cholecalciferol (VITAMIN D3) 25 MCG (1000 UNIT) tablet Take 1,000 Units by mouth daily.     ibuprofen (ADVIL) 200 MG tablet Take 200 mg by mouth daily as needed for headache or moderate pain.      Multiple Vitamins-Minerals (MULTIVITAMIN WITH MINERALS) tablet Take 1 tablet by mouth daily. Takes two gummies every morning.     valACYclovir (VALTREX) 1000 MG tablet TAKE 1 TABLET BY MOUTH EVERY DAY 30 tablet 1   zinc gluconate 50 MG tablet Take 50 mg by mouth daily.     No current facility-administered medications on file prior to visit.    Social History   Tobacco Use   Smoking status: Former    Packs/day: 0.30    Years: 5.00    Pack years: 1.50    Types: Cigarettes    Quit date: 01/14/2020    Years since quitting: 0.6   Smokeless tobacco: Never  Vaping Use   Vaping Use: Never used  Substance Use Topics   Alcohol use: Not Currently    Alcohol/week: 8.0 standard drinks    Types: 4 Glasses of  wine, 4 Cans of beer per week   Drug use: Never    Review of Systems  Constitutional:  Negative for chills and fever.  Respiratory:  Negative for cough.   Cardiovascular:  Negative for chest pain and palpitations.  Gastrointestinal:  Negative for nausea and vomiting.     Objective:    There were no vitals taken for this visit. BP Readings from Last 3 Encounters:  01/28/20 108/60  12/15/19 (!) 85/58  07/09/19 (!) 91/50   Wt Readings from Last 3 Encounters:  01/28/20 118 lb 9.6 oz (53.8 kg)  01/12/20 115 lb (52.2 kg)  12/15/19 115 lb 9.6 oz (52.4 kg)    Physical Exam Vitals reviewed.  Constitutional:      Appearance: She is well-developed.  Eyes:     Conjunctiva/sclera: Conjunctivae normal.  Cardiovascular:     Rate and Rhythm: Normal rate and regular rhythm.     Pulses: Normal  pulses.     Heart sounds: Normal heart sounds.  Pulmonary:     Effort: Pulmonary effort is normal.     Breath sounds: Normal breath sounds. No wheezing, rhonchi or rales.  Skin:    General: Skin is warm and dry.  Neurological:     Mental Status: She is alert.  Psychiatric:        Speech: Speech normal.        Behavior: Behavior normal.        Thought Content: Thought content normal.       Assessment & Plan:   Problem List Items Addressed This Visit       Other   Family history of blood disorder - Primary    Familial history of hematochromatosis.  Pending repeat iron stores, CBC, liver enzymes.  If normal, advised patient that she is still need to see hematology for confirmation that she does not carry the genes for hematochromatosis.  Will follow.  Referral to hematology has been placed      Relevant Orders   CBC with Differential/Platelet   Lipid panel   IBC + Ferritin   Comprehensive metabolic panel   Ambulatory referral to Hematology / Oncology     I am having Stephanie Lyons "Stephanie Lyons" maintain her albuterol, multivitamin with minerals, ibuprofen, APPLE CIDER VINEGAR PO, zinc gluconate, cholecalciferol, and valACYclovir.   No orders of the defined types were placed in this encounter.   Return precautions given.   Risks, benefits, and alternatives of the medications and treatment plan prescribed today were discussed, and patient expressed understanding.   Education regarding symptom management and diagnosis given to patient on AVS.  Continue to follow with Stephanie Grana, FNP for routine health maintenance.   Stephanie Lyons and I agreed with plan.   Rennie Plowman, FNP

## 2020-09-01 NOTE — Assessment & Plan Note (Signed)
Familial history of hematochromatosis.  Pending repeat iron stores, CBC, liver enzymes.  If normal, advised patient that she is still need to see hematology for confirmation that she does not carry the genes for hematochromatosis.  Will follow.  Referral to hematology has been placed

## 2020-09-01 NOTE — Patient Instructions (Signed)
Referral to hematology  Let us know if you dont hear back within a week in regards to an appointment being scheduled.     

## 2020-09-06 ENCOUNTER — Other Ambulatory Visit: Payer: Self-pay | Admitting: Surgery

## 2020-09-06 ENCOUNTER — Encounter: Payer: Self-pay | Admitting: Family

## 2020-09-06 NOTE — Telephone Encounter (Signed)
Patient calling in and states that hematology states she should be able to get all her blood work with PCP 09/14/20 and that Patient should not need to see hematology.   Patient states she is confused, does she need to see pcp, hematology, or both?

## 2020-09-07 NOTE — Telephone Encounter (Signed)
See other pt advice request 

## 2020-09-12 ENCOUNTER — Encounter: Payer: Self-pay | Admitting: Family

## 2020-09-14 ENCOUNTER — Other Ambulatory Visit: Payer: Commercial Managed Care - PPO

## 2020-09-14 ENCOUNTER — Encounter: Payer: Self-pay | Admitting: Oncology

## 2020-09-14 ENCOUNTER — Inpatient Hospital Stay (HOSPITAL_BASED_OUTPATIENT_CLINIC_OR_DEPARTMENT_OTHER): Payer: Commercial Managed Care - PPO | Admitting: Oncology

## 2020-09-14 ENCOUNTER — Inpatient Hospital Stay: Payer: Commercial Managed Care - PPO | Attending: Oncology

## 2020-09-14 ENCOUNTER — Other Ambulatory Visit: Payer: Self-pay

## 2020-09-14 ENCOUNTER — Encounter: Payer: Commercial Managed Care - PPO | Admitting: Licensed Clinical Social Worker

## 2020-09-14 VITALS — BP 102/53 | HR 69 | Temp 96.7°F | Resp 14 | Wt 134.0 lb

## 2020-09-14 DIAGNOSIS — Z8349 Family history of other endocrine, nutritional and metabolic diseases: Secondary | ICD-10-CM | POA: Diagnosis not present

## 2020-09-14 DIAGNOSIS — J45909 Unspecified asthma, uncomplicated: Secondary | ICD-10-CM | POA: Insufficient documentation

## 2020-09-14 DIAGNOSIS — M129 Arthropathy, unspecified: Secondary | ICD-10-CM | POA: Insufficient documentation

## 2020-09-14 DIAGNOSIS — M542 Cervicalgia: Secondary | ICD-10-CM | POA: Insufficient documentation

## 2020-09-14 DIAGNOSIS — Z0000 Encounter for general adult medical examination without abnormal findings: Secondary | ICD-10-CM

## 2020-09-14 DIAGNOSIS — K219 Gastro-esophageal reflux disease without esophagitis: Secondary | ICD-10-CM | POA: Diagnosis not present

## 2020-09-14 DIAGNOSIS — Z79899 Other long term (current) drug therapy: Secondary | ICD-10-CM | POA: Insufficient documentation

## 2020-09-14 DIAGNOSIS — Z832 Family history of diseases of the blood and blood-forming organs and certain disorders involving the immune mechanism: Secondary | ICD-10-CM | POA: Insufficient documentation

## 2020-09-14 LAB — CBC WITH DIFFERENTIAL/PLATELET
Abs Immature Granulocytes: 0.03 10*3/uL (ref 0.00–0.07)
Basophils Absolute: 0.1 10*3/uL (ref 0.0–0.1)
Basophils Relative: 1 %
Eosinophils Absolute: 0.2 10*3/uL (ref 0.0–0.5)
Eosinophils Relative: 1 %
HCT: 40.9 % (ref 36.0–46.0)
Hemoglobin: 13.8 g/dL (ref 12.0–15.0)
Immature Granulocytes: 0 %
Lymphocytes Relative: 20 %
Lymphs Abs: 2.3 10*3/uL (ref 0.7–4.0)
MCH: 32.8 pg (ref 26.0–34.0)
MCHC: 33.7 g/dL (ref 30.0–36.0)
MCV: 97.1 fL (ref 80.0–100.0)
Monocytes Absolute: 0.9 10*3/uL (ref 0.1–1.0)
Monocytes Relative: 8 %
Neutro Abs: 8.2 10*3/uL — ABNORMAL HIGH (ref 1.7–7.7)
Neutrophils Relative %: 70 %
Platelets: 306 10*3/uL (ref 150–400)
RBC: 4.21 MIL/uL (ref 3.87–5.11)
RDW: 12.2 % (ref 11.5–15.5)
WBC: 11.6 10*3/uL — ABNORMAL HIGH (ref 4.0–10.5)
nRBC: 0 % (ref 0.0–0.2)

## 2020-09-14 LAB — COMPREHENSIVE METABOLIC PANEL
ALT: 15 U/L (ref 0–44)
AST: 14 U/L — ABNORMAL LOW (ref 15–41)
Albumin: 4.2 g/dL (ref 3.5–5.0)
Alkaline Phosphatase: 40 U/L (ref 38–126)
Anion gap: 8 (ref 5–15)
BUN: 27 mg/dL — ABNORMAL HIGH (ref 6–20)
CO2: 27 mmol/L (ref 22–32)
Calcium: 9.2 mg/dL (ref 8.9–10.3)
Chloride: 99 mmol/L (ref 98–111)
Creatinine, Ser: 0.68 mg/dL (ref 0.44–1.00)
GFR, Estimated: >60 mL/min (ref >60)
Glucose, Bld: 106 mg/dL — ABNORMAL HIGH (ref 70–99)
Potassium: 4.2 mmol/L (ref 3.5–5.1)
Sodium: 134 mmol/L — ABNORMAL LOW (ref 135–145)
Total Bilirubin: 0.2 mg/dL — ABNORMAL LOW (ref 0.3–1.2)
Total Protein: 7.7 g/dL (ref 6.5–8.1)

## 2020-09-14 LAB — FERRITIN: Ferritin: 80 ng/mL (ref 11–307)

## 2020-09-14 LAB — IRON AND TIBC
Iron: 38 ug/dL (ref 28–170)
Saturation Ratios: 13 % (ref 10.4–31.8)
TIBC: 284 ug/dL (ref 250–450)
UIBC: 246 ug/dL

## 2020-09-14 LAB — LIPID PANEL
Cholesterol: 122 mg/dL (ref 0–200)
HDL: 50 mg/dL (ref >40)
LDL Cholesterol: 61 mg/dL (ref 0–99)
Total CHOL/HDL Ratio: 2.4 RATIO
Triglycerides: 53 mg/dL (ref <150)
VLDL: 11 mg/dL (ref 0–40)

## 2020-09-14 NOTE — Addendum Note (Signed)
Addended by: Rickard Patience on: 09/14/2020 09:57 PM   Modules accepted: Orders

## 2020-09-14 NOTE — Progress Notes (Signed)
New patient referred by Dr Norris Cross for family history of elevated iron levels. Pt mother has 2 mutations that pt is requesting testing for.

## 2020-09-14 NOTE — Progress Notes (Signed)
Hematology/Oncology Consult note [facility] [phone] [phone]   Patient Care Team: Allegra GranaArnett, Margaret G, FNP as PCP - General (Family Medicine)  REFERRING PROVIDER: Allegra GranaArnett, Margaret G, FNP  CHIEF COMPLAINTS/REASON FOR VISIT:  Evaluation of family history of hemochromatosis  HISTORY OF PRESENTING ILLNESS:   Stephanie Lyons is a  40 y.o.  female with PMH listed below was seen in consultation at the request of  Allegra Granarnett, Margaret G, FNP  for evaluation of family history of hemochromatosis.  Patient reports that her mother was recently diagnosed with iron overload secondary to 2 copies of hemochromatosis gene mutation.  She is concerned and wanted to have checked.  Refer to hematology for further evaluation. Patient has had iron panel tested about 8 months ago and there is no signs of iron overload. She has had blood work appointment scheduled by her primary care provider and she did not make it.  Patient drinks alcohol a few times per week.  She is single and has no children.  Review of Systems  Constitutional:  Negative for appetite change, chills, fatigue and fever.  HENT:   Negative for hearing loss and voice change.   Eyes:  Negative for eye problems.  Respiratory:  Negative for chest tightness and cough.   Cardiovascular:  Negative for chest pain.  Gastrointestinal:  Negative for abdominal distention, abdominal pain and blood in stool.  Endocrine: Negative for hot flashes.  Genitourinary:  Negative for difficulty urinating and frequency.   Musculoskeletal:  Negative for arthralgias.  Skin:  Negative for itching and rash.  Neurological:  Negative for extremity weakness.  Hematological:  Negative for adenopathy.  Psychiatric/Behavioral:  Negative for confusion.    MEDICAL HISTORY:  Past Medical History:  Diagnosis Date   Arthritis    Asthma    Carpal tunnel syndrome, right    Chicken pox    Complication of anesthesia     Depression    Family history of adverse reaction to anesthesia    brother becomes aggressive   GERD (gastroesophageal reflux disease)    rare   Neck pain    PONV (postoperative nausea and vomiting)    PT STATES AS LONG AS ANTIEMETIC GIVEN THRU IV SHE DOES WELL AND DOES NOT GET SICK-HAS NEVER HAD SCOPALAMINE PATCH    SURGICAL HISTORY: Past Surgical History:  Procedure Laterality Date   CARPAL TUNNEL RELEASE Right 04/02/2015   Procedure: OPEN CARPAL TUNNEL RELEASE;  Surgeon: Erin SonsHarold Kernodle, MD;  [facility];  Service: Orthopedics;  Laterality: Right;   CARPAL TUNNEL RELEASE Left 07/09/2019   Procedure: CARPAL TUNNEL RELEASE ENDOSCOPIC WITH EXCISION OF LEFT DORAL CARPAL GANGLION;  Surgeon: Christena FlakePoggi, John J, MD;  Location: [facility];  Service: Orthopedics;  Laterality: Left;   FOOT SURGERY Right    HAND SURGERY     R- hand 2019, L-hand (2x) 2019   left foot surgery     SHOULDER SURGERY     TONSILLECTOMY  2012    SOCIAL HISTORY: Social History   Socioeconomic History   Marital status: Single    Spouse name: Not on file   Number of children: Not on file   Years of education: Not on file   Highest education level: Not on file  Occupational History   Not on file  Tobacco Use   Smoking status: Former    Packs/day: 0.30    Years: 5.00    Pack years: 1.50    Types: Cigarettes    Quit date: 01/14/2020  Years since quitting: 0.6   Smokeless tobacco: Never  Vaping Use   Vaping Use: Never used  Substance and Sexual Activity   Alcohol use: Not Currently    Alcohol/week: 8.0 standard drinks    Types: 4 Glasses of wine, 4 Cans of beer per week   Drug use: Never   Sexual activity: Yes    Partners: Female  Other Topics Concern   Not on file  Social History Narrative   Has Fiance      Works as Production designer, theatre/television/film at [employer]      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 No further alcohol use.    Social Determinants of Health   Financial  Resource Strain: Not on file  Food Insecurity: Not on file  Transportation Needs: Not on file  Physical Activity: Not on file  Stress: Not on file  Social Connections: Not on file  Intimate Partner Violence: Not on file    FAMILY HISTORY: Family History  Problem Relation Age of Onset   Hypertension Mother    Hemochromatosis Mother    Hypertension Maternal Grandmother    Hypertension Maternal Grandfather     ALLERGIES:  is allergic to dust mite extract, morphine and related, percocet [oxycodone-acetaminophen], prednisone, and kiwi extract.  MEDICATIONS:  Current Outpatient Medications  Medication Sig Dispense Refill   albuterol (VENTOLIN HFA) 108 (90 Base) MCG/ACT inhaler Inhale 2 puffs into the lungs every 6 (six) hours as needed for wheezing or shortness of breath. 18 g 3   APPLE CIDER VINEGAR PO Take 2 capsules by mouth in the morning and at bedtime.     chlorhexidine (PERIDEX) 0.12 % solution SMARTSIG:By Mouth     ibuprofen (ADVIL) 200 MG tablet Take 200 mg by mouth daily as needed for headache or moderate pain.      Multiple Vitamins-Minerals (MULTIVITAMIN WITH MINERALS) tablet Take 1 tablet by mouth daily. Takes two gummies every morning.     valACYclovir (VALTREX) 1000 MG tablet TAKE 1 TABLET BY MOUTH EVERY DAY 30 tablet 1   No current facility-administered medications for this visit.     PHYSICAL EXAMINATION: ECOG PERFORMANCE STATUS: 0 - Asymptomatic Vitals:   09/14/20 1457  BP: (!) 102/53  Pulse: 69  Resp: 14  Temp: (!) 96.7 F (35.9 C)  SpO2: 100%   Filed Weights   09/14/20 1457  Weight: 134 lb (60.8 kg)    Physical Exam Constitutional:      General: She is not in acute distress. HENT:     Head: Normocephalic and atraumatic.  Eyes:     General: No scleral icterus. Cardiovascular:     Rate and Rhythm: Normal rate and regular rhythm.     Heart sounds: Normal heart sounds.  Pulmonary:     Effort: Pulmonary effort is normal. No respiratory distress.      Breath sounds: No wheezing.  Abdominal:     General: Bowel sounds are normal. There is no distension.     Palpations: Abdomen is soft.  Musculoskeletal:        General: No deformity. Normal range of motion.     Cervical back: Normal range of motion and neck supple.  Skin:    General: Skin is warm and dry.     Findings: No erythema or rash.  Neurological:     Mental Status: She is alert and oriented to person, place, and time. Mental status is at baseline.     Cranial Nerves: No cranial nerve deficit.  Coordination: Coordination normal.  Psychiatric:        Mood and Affect: Mood normal.    LABORATORY DATA:  I have reviewed the data as listed Lab Results  Component Value Date   WBC 11.6 (H) 09/14/2020   HGB 13.8 09/14/2020   HCT 40.9 09/14/2020   MCV 97.1 09/14/2020   PLT 306 09/14/2020   Recent Labs    12/15/19 1549 09/14/20 1525  NA 137 134*  K 4.3 4.2  CL 103 99  CO2 28 27  GLUCOSE 77 106*  BUN 22 27*  CREATININE 0.62 0.68  CALCIUM 9.5 9.2  GFRNONAA  --  >60  PROT 7.2 7.7  ALBUMIN 4.5 4.2  AST 11 14*  ALT 9 15  ALKPHOS 43 40  BILITOT 0.3 0.2*   Iron/TIBC/Ferritin/ %Sat    Component Value Date/Time   IRON 38 09/14/2020 1525   TIBC 284 09/14/2020 1525   FERRITIN 80 09/14/2020 1525   IRONPCTSAT 13 09/14/2020 1525      RADIOGRAPHIC STUDIES: I have personally reviewed the radiological images as listed and agreed with the findings in the report. No results found.    ASSESSMENT & PLAN:  1. Family history of hemochromatosis   2. Healthcare maintenance    #Family history of hemochromatosis. I recommend patient to have hemochromatosis mutation screening testing. According to the history that her mother has 2 copies of hemochromatosis gene, it is very likely that she may be a carrier.  Discussed with her that majority of patients with carrier status are asymptomatic and do not need treatments. Check CBC, iron TIBC ferritin, CMP, hepatitis  panel.Hemochromatosis gene mutation PCR. Recommend patient to avoid alcohol, iron supplementation.  Patient missed her appointment for lipid panel.  She did not fast today.  Last meal was about 4 hours ago. Patient is okay with having lipid panel done today, to avoid the need of another lab appointment given that she has missed this morning's labs.  I will forward lipid panel to primary care provider.   Orders Placed This Encounter  Procedures   Iron and TIBC    Standing Status:   Future    Number of Occurrences:   1    Standing Expiration Date:   09/14/2021   Ferritin    Standing Status:   Future    Number of Occurrences:   1    Standing Expiration Date:   03/14/2021   CBC with Differential/Platelet    Standing Status:   Future    Number of Occurrences:   1    Standing Expiration Date:   09/14/2021   Comprehensive metabolic panel    Standing Status:   Future    Number of Occurrences:   1    Standing Expiration Date:   09/14/2021   Lipid panel    Standing Status:   Future    Number of Occurrences:   1    Standing Expiration Date:   09/14/2021   Hemochromatosis DNA-PCR(c282y,h63d)    Standing Status:   Future    Number of Occurrences:   1    Standing Expiration Date:   09/14/2021   Hepatitis panel, acute    Standing Status:   Future    Number of Occurrences:   1    Standing Expiration Date:   09/14/2021    All questions were answered. The patient knows to call the clinic with any problems questions or concerns.  cc Allegra Grana, FNP    Return of visit: 6  months Thank you for this kind referral and the opportunity to participate in the care of this patient. A copy of today's note is routed to referring provider    Rickard Patience, MD, PhD Hematology Oncology [facility] 09/14/2020

## 2020-09-15 ENCOUNTER — Other Ambulatory Visit: Payer: Commercial Managed Care - PPO

## 2020-09-15 ENCOUNTER — Telehealth: Payer: Self-pay

## 2020-09-15 LAB — HEPATITIS PANEL, ACUTE
HCV Ab: NONREACTIVE
Hep A IgM: NONREACTIVE
Hep B C IgM: NONREACTIVE
Hepatitis B Surface Ag: NONREACTIVE

## 2020-09-15 NOTE — Telephone Encounter (Signed)
Sent pt Mychart message with results and recommendations. Please schedule pt for labs in 6 months then MD 1-2 days after labs.

## 2020-09-15 NOTE — Telephone Encounter (Signed)
Appointments scheduled, appointment reminder mailed.

## 2020-09-15 NOTE — Telephone Encounter (Signed)
-----   Message from Rickard Patience, MD sent at 09/14/2020  9:59 PM EDT ----- Lanora Manis,  Please let patient know that her iron lab result shows no evidence of iron overload. Hemochromatosis screening testing is pending.  We have discussed about avoiding alcohol, avoid iron supplementation recommend patient to follow-up in 6 months.  Lab, MD 1 to 2 days after.  Please arrange.  Labs are ordered.

## 2020-09-17 ENCOUNTER — Other Ambulatory Visit: Payer: Self-pay | Admitting: Family

## 2020-09-17 DIAGNOSIS — B001 Herpesviral vesicular dermatitis: Secondary | ICD-10-CM

## 2020-09-19 ENCOUNTER — Other Ambulatory Visit: Payer: Self-pay | Admitting: Family

## 2020-09-19 DIAGNOSIS — R899 Unspecified abnormal finding in specimens from other organs, systems and tissues: Secondary | ICD-10-CM

## 2020-09-19 DIAGNOSIS — Z8349 Family history of other endocrine, nutritional and metabolic diseases: Secondary | ICD-10-CM

## 2020-09-21 LAB — HEMOCHROMATOSIS DNA-PCR(C282Y,H63D)

## 2020-09-22 ENCOUNTER — Telehealth: Payer: Self-pay | Admitting: *Deleted

## 2020-09-22 ENCOUNTER — Encounter: Payer: Self-pay | Admitting: Family

## 2020-09-22 ENCOUNTER — Ambulatory Visit: Admit: 2020-09-22 | Payer: Commercial Managed Care - PPO | Admitting: Surgery

## 2020-09-22 ENCOUNTER — Encounter: Payer: Self-pay | Admitting: Oncology

## 2020-09-22 SURGERY — ARTHROSCOPY, ELBOW, WITH OPEN SURGERY IF INDICATED
Anesthesia: Choice | Site: Elbow | Laterality: Left

## 2020-09-22 NOTE — Telephone Encounter (Signed)
Patient called asking for return call to go over test results. Her next appointment is not until March 2023  Iron and TIBC Order: [id number] Status: Final result   Visible to patient: Yes (seen)   Next appt: 03/04/2021 at 10:00 AM in Family Medicine (Rennie Plowman, FNP)   Dx: Family history of hemochromatosis; He...   3 Result Notes   1 Patient Communication   1 Follow-up Encounter Component Ref Range & Units 8 d ago 9 mo ago  Iron 28 - 170 ug/dL 38  53 R   TIBC 818 - 299 ug/dL 371    Saturation Ratios 10.4 - 31.8 % 13  17.4 Low  R   UIBC ug/dL 696    Comment: Performed at [facility], [address] [place], [place] [address]  Resulting Agency  [facility] CLIN LAB [place] [facility]         Specimen Collected: 09/14/20 15:25 Last Resulted: 09/14/20 16:22        Ferritin Order: [id number] Status: Final result   Visible to patient: Yes (seen)   Next appt: 03/04/2021 at 10:00 AM in Family Medicine (Rennie Plowman, FNP)   Dx: Family history of hemochromatosis; He...   3 Result Notes   1 Patient Communication   1 Follow-up Encounter Component Ref Range & Units 8 d ago 9 mo ago  Ferritin 11 - 307 ng/mL 80  83.6 R   Comment: Performed at [facility], [address] [place], [place] [address]  Resulting Agency  [facility] CLIN LAB [place] [facility]         Specimen Collected: 09/14/20 15:25 Last Resulted: 09/14/20 16:22      CBC with Differential/Platelet Order: [id number] Status: Final result   Visible to patient: Yes (seen)   Next appt: 03/04/2021 at 10:00 AM in Family Medicine (Rennie Plowman, FNP)   Dx: Family history of hemochromatosis; He...   3 Result Notes   1 Patient Communication   1 Follow-up Encounter Component Ref Range & Units 8 d ago 9 mo ago 5 yr ago  WBC 4.0 - 10.5 K/uL 11.6 High   10.3  14.6 High    RBC 3.87 - 5.11 MIL/uL 4.21  4.43 R  5.16 High  R   Hemoglobin 12.0 - 15.0 g/dL 44.3  15.4  00.8 High    HCT 36.0 - 46.0 % 40.9  44.2  46.8 High     MCV 80.0 - 100.0 fL 97.1  99.7 R  90.7 R   MCH 26.0 - 34.0 pg 32.8     MCHC 30.0 - 36.0 g/dL 67.6  19.5  09.3   RDW 11.5 - 15.5 % 12.2  13.1  13.5   Platelets 150 - 400 K/uL 306  313.0 R  384.0 R   nRBC 0.0 - 0.2 % 0.0     Neutrophils Relative % % 70  63.3 R    Neutro Abs 1.7 - 7.7 K/uL 8.2 High   6.5 R    Lymphocytes Relative % 20  27.4 R    Lymphs Abs 0.7 - 4.0 K/uL 2.3  2.8    Monocytes Relative % 8  7.2 R    Monocytes Absolute 0.1 - 1.0 K/uL 0.9  0.7    Eosinophils Relative % 1  1.4 R    Eosinophils Absolute 0.0 - 0.5 K/uL 0.2  0.1 R    Basophils Relative % 1  0.7 R    Basophils Absolute 0.0 - 0.1 K/uL 0.1  0.1  Immature Granulocytes % 0     Abs Immature Granulocytes 0.00 - 0.07 K/uL 0.03     Comment: Performed at [facility], [address] [place], [place] [address]  Resulting Agency  [facility] CL[facility] LAB [place] [facility]         Specimen Collected: 09/14/20 15:25 Last Resulted: 09/14/20 15:34

## 2020-09-22 NOTE — Telephone Encounter (Signed)
Pt informed via mychart

## 2020-10-18 ENCOUNTER — Other Ambulatory Visit: Payer: Self-pay | Admitting: Family

## 2020-10-18 DIAGNOSIS — B001 Herpesviral vesicular dermatitis: Secondary | ICD-10-CM

## 2020-11-15 ENCOUNTER — Other Ambulatory Visit: Payer: Self-pay | Admitting: Family

## 2020-11-15 DIAGNOSIS — B001 Herpesviral vesicular dermatitis: Secondary | ICD-10-CM

## 2020-12-12 ENCOUNTER — Other Ambulatory Visit: Payer: Self-pay | Admitting: Family

## 2020-12-12 DIAGNOSIS — B001 Herpesviral vesicular dermatitis: Secondary | ICD-10-CM

## 2021-01-11 ENCOUNTER — Other Ambulatory Visit: Payer: Self-pay | Admitting: Family

## 2021-01-11 DIAGNOSIS — B001 Herpesviral vesicular dermatitis: Secondary | ICD-10-CM

## 2021-01-15 ENCOUNTER — Encounter: Payer: Self-pay | Admitting: Family

## 2021-01-16 ENCOUNTER — Encounter: Payer: Self-pay | Admitting: Family

## 2021-01-17 ENCOUNTER — Telehealth (INDEPENDENT_AMBULATORY_CARE_PROVIDER_SITE_OTHER): Payer: Commercial Managed Care - PPO | Admitting: Physician Assistant

## 2021-01-17 VITALS — Temp 97.4°F | Ht 62.0 in

## 2021-01-17 DIAGNOSIS — U071 COVID-19: Secondary | ICD-10-CM | POA: Diagnosis not present

## 2021-01-17 NOTE — Progress Notes (Signed)
Virtual Visit via Video Note  I connected with  Stephanie Lyons  on 01/17/21 at 11:45 AM EST by a video enabled telemedicine application and verified that I am speaking with the correct person using two identifiers.  Location: Patient: home Provider: Therapist, music at [place] present: Patient and myself   I discussed the limitations of evaluation and management by telemedicine and the availability of in person appointments. The patient expressed understanding and agreed to proceed.   History of Present Illness:  Chief complaint: COVID-19 positive via home test on 01/15/21 Symptom onset: 01/15/2021 Pertinent positives: Fatigue, fever has resolved; body aches present, congestion Pertinent negatives: Cough, ST, N/V/D, CP, SOB Treatments tried: Mucinex All-in-one OTC, Zinc, D3, multi-vitamin  Vaccine status: None Sick exposure: No known sick contacts; goes to the gym; no sick co-workers  -COVID-19 around same time last year and says that illness was worse than how she is feeling now.  -One year smoke-free.   Observations/Objective:  Temp 99.4 F last night; 97.4 F this morning  Gen: Awake, alert, no acute distress Resp: Breathing is even and non-labored Psych: calm/pleasant demeanor Neuro: Alert and Oriented x 3, + facial symmetry, speech is clear.   Assessment and Plan:  1. COVID-19 Diagnosis confirmed via home antigen test.  Patient is currently having mild symptoms.  We discussed current algorithm recommendations for prescribing outpatient antivirals.  As the patient is not severely immunocompromised & under the age of 22, antiviral treatment is not indicated at this time.  Risks versus benefits discussed.  Advised self-isolation at home for the next 5 days and then masking around others for at least an additional 5 days.  Treat supportively at this time including sleeping prone, deep breathing exercises, pushing fluids, walking every few hours, vitamins C  and D, and Tylenol or ibuprofen as needed.  The patient understands that COVID-19 illness can wax and wane.  Should the symptoms acutely worsen or patient starts to experience sudden shortness of breath, chest pain, severe weakness, the patient will go straight to the emergency department.  Also advised home pulse oximetry monitoring and for any reading consistently under 92%, should also report to the emergency department.  The patient will continue to keep [place] updated.    Follow Up Instructions:    I discussed the assessment and treatment plan with the patient. The patient was provided an opportunity to ask questions and all were answered. The patient agreed with the plan and demonstrated an understanding of the instructions.   The patient was advised to call back or seek an in-person evaluation if the symptoms worsen or if the condition fails to improve as anticipated.  Dontreal Miera M Bonham Zingale, PA-C

## 2021-01-18 ENCOUNTER — Telehealth: Payer: Commercial Managed Care - PPO | Admitting: Nurse Practitioner

## 2021-02-12 ENCOUNTER — Other Ambulatory Visit: Payer: Self-pay | Admitting: Family

## 2021-02-12 DIAGNOSIS — B001 Herpesviral vesicular dermatitis: Secondary | ICD-10-CM

## 2021-02-18 NOTE — Telephone Encounter (Signed)
error 

## 2021-03-04 ENCOUNTER — Ambulatory Visit: Payer: Commercial Managed Care - PPO | Admitting: Family

## 2021-03-09 ENCOUNTER — Other Ambulatory Visit: Payer: Self-pay | Admitting: *Deleted

## 2021-03-09 DIAGNOSIS — Z8349 Family history of other endocrine, nutritional and metabolic diseases: Secondary | ICD-10-CM

## 2021-03-10 ENCOUNTER — Telehealth: Payer: Self-pay | Admitting: *Deleted

## 2021-03-10 IMAGING — MR MRI OF THE RIGHT ANKLE WITHOUT CONTRAST
5 series · 40 of 40 positions shown · non-contrast
Comparison: Right foot x-rays dated May 17, 2018.

CLINICAL DATA: Chronic right ankle pain.

EXAM:
MRI OF THE RIGHT ANKLE WITHOUT CONTRAST
TECHNIQUE: Multiplanar, multisequence MR imaging of the ankle was performed. No
intravenous contrast was administered.

[Series 3: PD fat-sat · axial · right · 3.0mm · 0.50mm/px · z∈[-91,+49]mm · 10 of 36 slices shown]
[im 1/36]
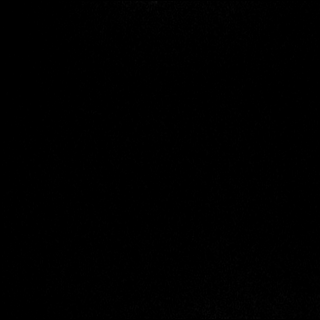
[im 4/36]
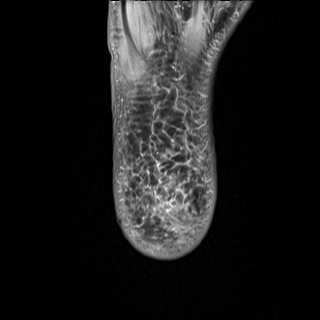
[im 8/36]
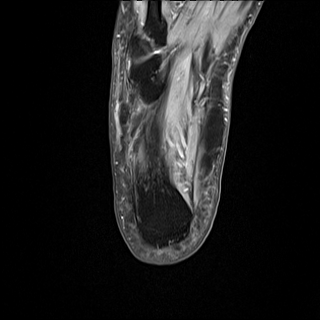
[im 12/36]
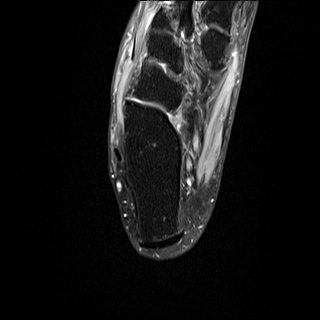
[im 16/36]
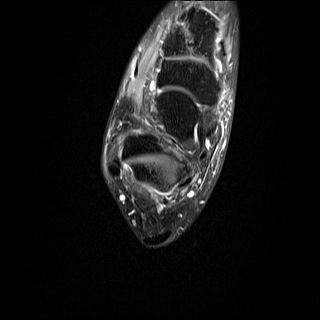
[im 20/36]
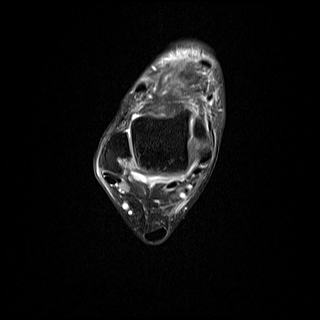
[im 24/36]
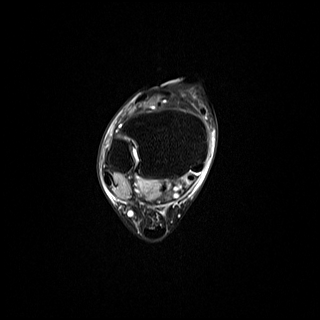
[im 28/36]
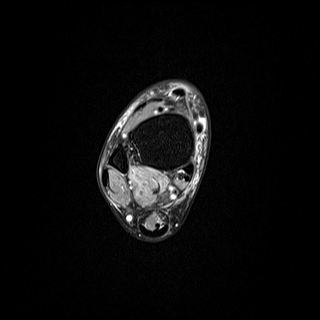
[im 32/36]
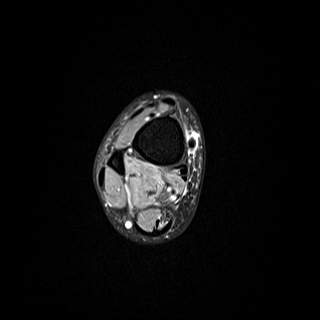
[im 36/36]
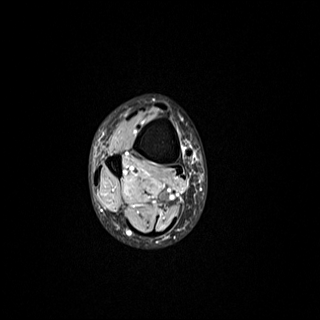

[Series 4: T2 fat-sat · axial · right · 3.0mm · 0.50mm/px · z∈[-91,+49]mm · 10 of 36 slices shown (1 of 2)]
[im 1/36]
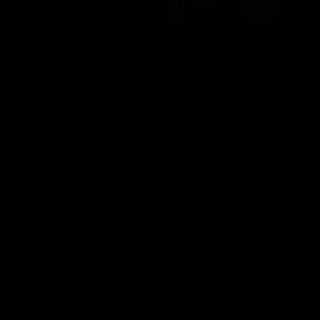
[im 4/36]
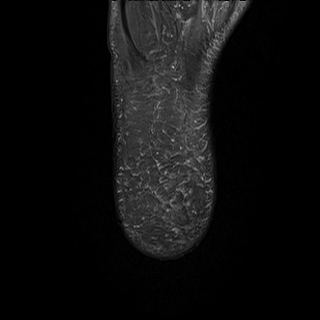
[im 8/36]
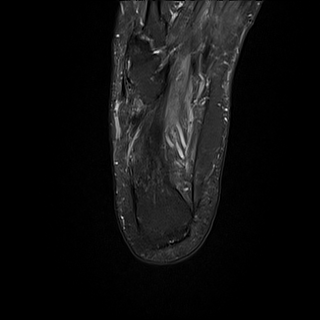
[im 12/36]
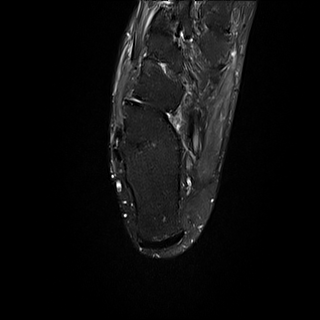
[im 16/36]
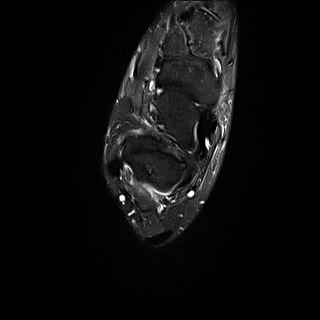
[im 20/36]
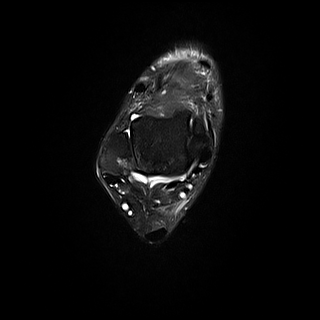
[im 24/36]
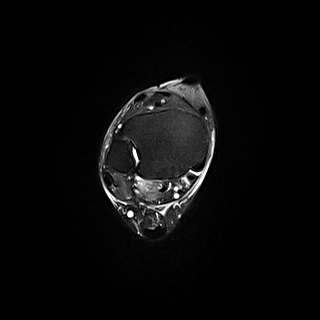
[im 28/36]
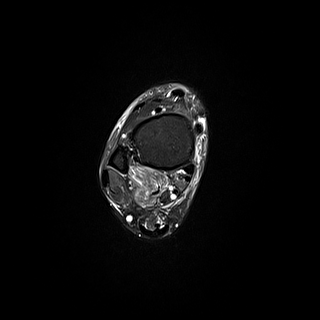
[im 32/36]
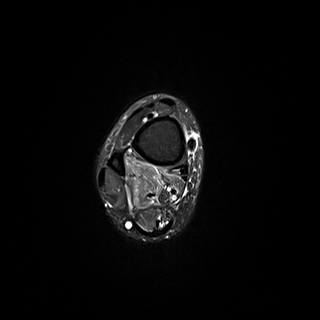
[im 36/36]
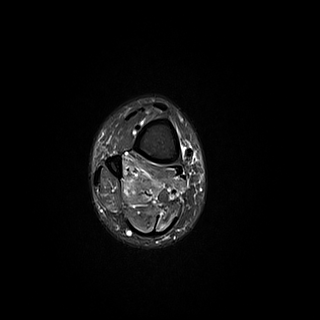

[Series 5: T2 fat-sat · coronal · right · 3.0mm · 0.62mm/px · 10 of 36 slices shown (2 of 2)]
[im 1/36]
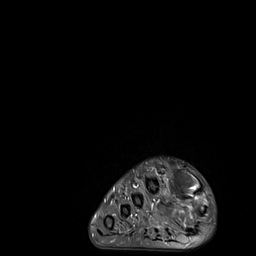
[im 4/36]
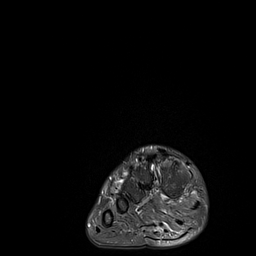
[im 8/36]
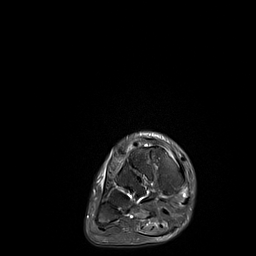
[im 12/36]
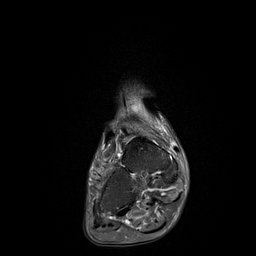
[im 16/36]
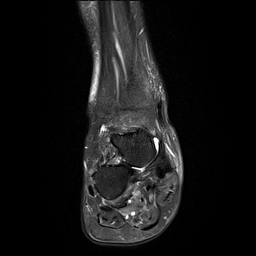
[im 20/36]
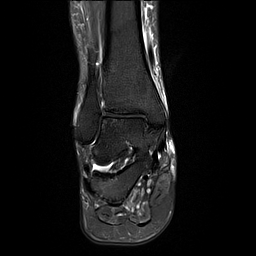
[im 24/36]
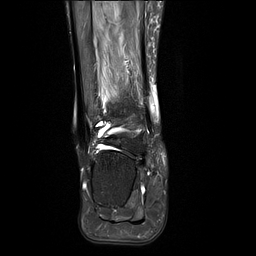
[im 28/36]
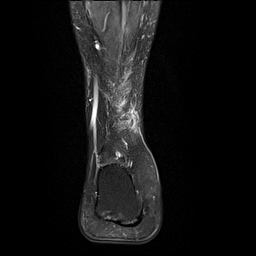
[im 32/36]
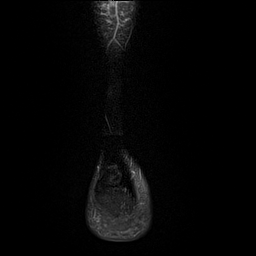
[im 36/36]
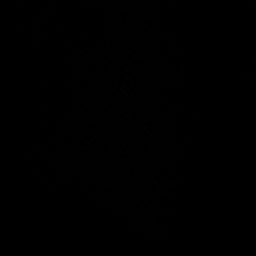

[Series 6: T1 · sagittal · right · 4.0mm · 0.70mm/px · 5 of 18 slices shown]
[im 1/18]
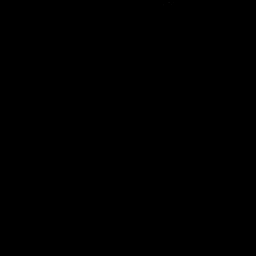
[im 5/18]
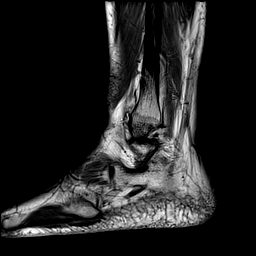
[im 9/18]
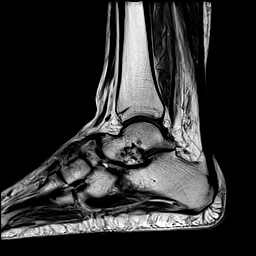
[im 13/18]
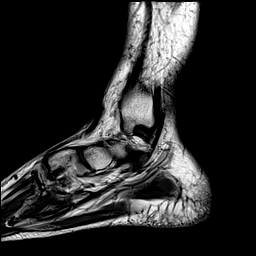
[im 18/18]
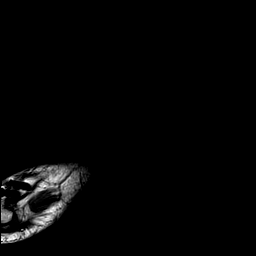

[Series 7: STIR · sagittal · right · 4.0mm · 0.35mm/px · 5 of 18 slices shown]
[im 1/18]
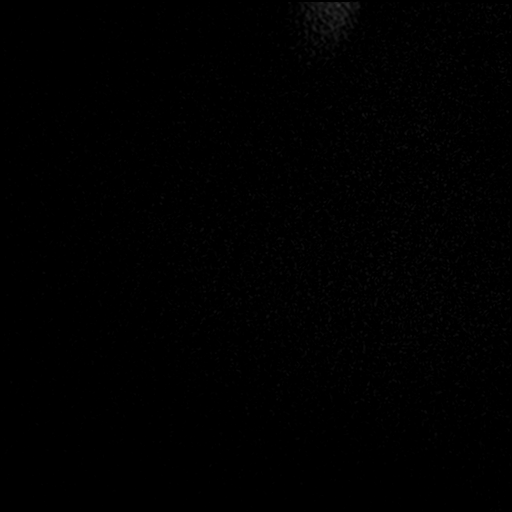
[im 5/18]
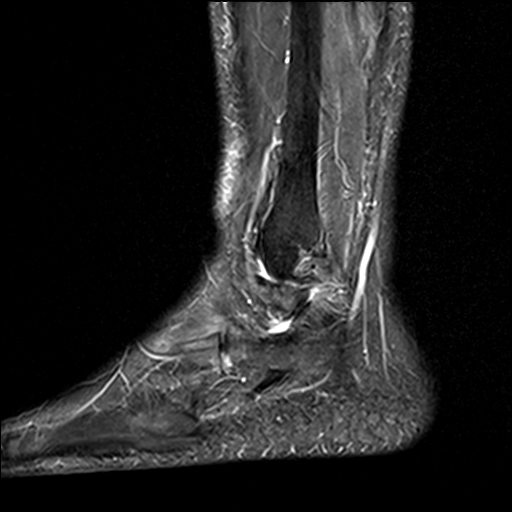
[im 9/18]
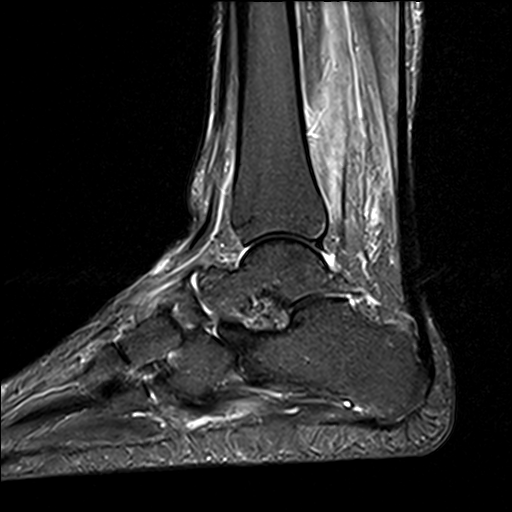
[im 13/18]
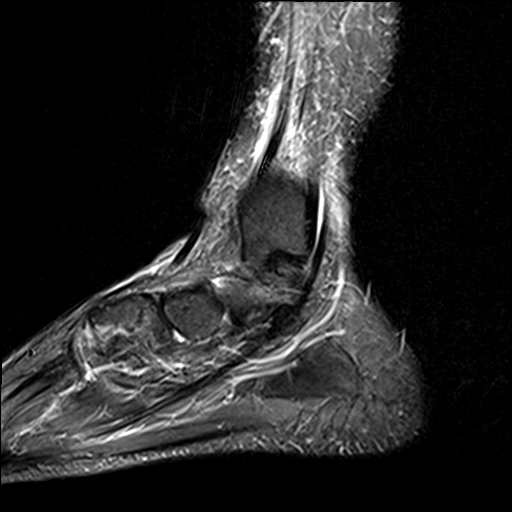
[im 18/18]
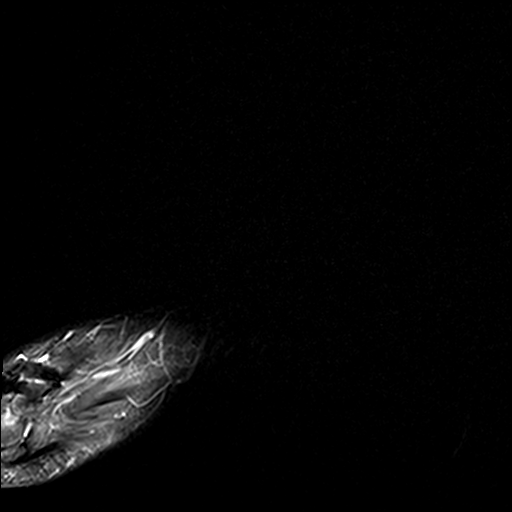

[40 of 40 positions shown; findings below may reference images not displayed]

FINDINGS: TENDONS

Peroneal: Peroneal longus tendon intact. Peroneal brevis intact.

Posteromedial: Posterior tibial tendon intact. Flexor hallucis
longus tendon intact. Flexor digitorum longus tendon intact.

Anterior: Tibialis anterior tendon intact. Extensor hallucis longus
tendon intact Extensor digitorum longus tendon intact.

Achilles:  Intact.

Plantar Fascia: Intact.

LIGAMENTS

Lateral: Anterior talofibular ligament intact. Calcaneofibular
ligament intact. Posterior talofibular ligament intact. Anterior and
posterior tibiofibular ligaments intact.

Medial: Deltoid ligament intact. Spring ligament intact.

CARTILAGE

Ankle Joint: No joint effusion. Normal ankle mortise. No chondral
defect.

Subtalar Joints/Sinus Tarsi: Normal subtalar joints. No subtalar
joint effusion. Normal sinus tarsi.

Bones: Patchy, predominantly subcortical foci of increased T2 marrow
signal, most prominent within the midfoot, likely reflecting
osteopenia. No suspicious bone lesion. Mild naviculocuneiform and
first TMT joint degenerative changes. No fracture or dislocation. Os
trigonum. Os navicular.

Soft Tissue: No soft tissue mass or fluid collection. Nonspecific
edema involving the flexor hallucis longus muscle.
IMPRESSION: 1. No tendon or ligamentous injury.
2. No acute osseous abnormality. Mild naviculocuneiform and first
TMT joint degenerative changes.
3. Nonspecific edema involving the flexor hallucis longus muscle.

## 2021-03-10 NOTE — Telephone Encounter (Signed)
Patient needs to reschedule appointment. ?

## 2021-03-15 ENCOUNTER — Other Ambulatory Visit: Payer: Commercial Managed Care - PPO

## 2021-03-17 ENCOUNTER — Ambulatory Visit: Payer: Commercial Managed Care - PPO | Admitting: Oncology

## 2021-03-17 ENCOUNTER — Other Ambulatory Visit: Payer: Self-pay | Admitting: Family

## 2021-03-17 DIAGNOSIS — B001 Herpesviral vesicular dermatitis: Secondary | ICD-10-CM

## 2021-05-29 ENCOUNTER — Encounter: Payer: Self-pay | Admitting: Family

## 2021-05-30 ENCOUNTER — Other Ambulatory Visit: Payer: Self-pay

## 2021-05-30 DIAGNOSIS — B001 Herpesviral vesicular dermatitis: Secondary | ICD-10-CM

## 2021-05-30 MED ORDER — VALACYCLOVIR HCL 1 G PO TABS: 1000.0000 mg | ORAL_TABLET | Freq: Every day | ORAL | 1 refills | Status: DC

## 2021-06-01 ENCOUNTER — Ambulatory Visit: Payer: Commercial Managed Care - PPO | Admitting: Family

## 2021-06-05 ENCOUNTER — Other Ambulatory Visit: Payer: Self-pay | Admitting: Family

## 2021-06-05 DIAGNOSIS — B001 Herpesviral vesicular dermatitis: Secondary | ICD-10-CM

## 2021-06-08 ENCOUNTER — Encounter: Payer: Self-pay | Admitting: Family

## 2021-06-09 NOTE — Telephone Encounter (Signed)
Spoke to patient and she no longer needed to take them to Dr. Her Sister is taking care of her daughter and she is not traveling with them out of town.

## 2021-07-07 ENCOUNTER — Other Ambulatory Visit: Payer: Self-pay | Admitting: Family

## 2021-07-07 DIAGNOSIS — B001 Herpesviral vesicular dermatitis: Secondary | ICD-10-CM

## 2021-08-10 ENCOUNTER — Other Ambulatory Visit: Payer: Self-pay | Admitting: Family

## 2021-08-10 DIAGNOSIS — B001 Herpesviral vesicular dermatitis: Secondary | ICD-10-CM

## 2021-09-13 ENCOUNTER — Other Ambulatory Visit: Payer: Self-pay | Admitting: Family

## 2021-09-13 DIAGNOSIS — B001 Herpesviral vesicular dermatitis: Secondary | ICD-10-CM

## 2021-09-14 ENCOUNTER — Encounter: Payer: Self-pay | Admitting: Family

## 2021-09-14 ENCOUNTER — Other Ambulatory Visit: Payer: Self-pay | Admitting: Family

## 2021-09-14 ENCOUNTER — Other Ambulatory Visit: Payer: Self-pay

## 2021-09-14 DIAGNOSIS — Z8619 Personal history of other infectious and parasitic diseases: Secondary | ICD-10-CM

## 2021-09-14 MED ORDER — VALACYCLOVIR HCL 500 MG PO TABS: 500.0000 mg | ORAL_TABLET | Freq: Every day | ORAL | 1 refills | Status: DC

## 2021-09-14 MED ORDER — VALACYCLOVIR HCL 1 G PO TABS: ORAL_TABLET | ORAL | 2 refills | Status: DC

## 2022-02-09 ENCOUNTER — Other Ambulatory Visit: Payer: Self-pay | Admitting: Family

## 2022-02-09 ENCOUNTER — Encounter: Payer: Self-pay | Admitting: Family

## 2022-02-09 DIAGNOSIS — Z8619 Personal history of other infectious and parasitic diseases: Secondary | ICD-10-CM

## 2022-03-13 ENCOUNTER — Encounter: Payer: Self-pay | Admitting: Family

## 2022-03-14 ENCOUNTER — Encounter: Payer: Self-pay | Admitting: Family

## 2022-03-14 ENCOUNTER — Ambulatory Visit (INDEPENDENT_AMBULATORY_CARE_PROVIDER_SITE_OTHER): Payer: BC Managed Care – PPO | Admitting: Family

## 2022-03-14 ENCOUNTER — Ambulatory Visit (INDEPENDENT_AMBULATORY_CARE_PROVIDER_SITE_OTHER): Payer: BC Managed Care – PPO

## 2022-03-14 VITALS — BP 122/82 | HR 76 | Temp 97.7°F | Ht 62.0 in | Wt 145.4 lb

## 2022-03-14 DIAGNOSIS — M5442 Lumbago with sciatica, left side: Secondary | ICD-10-CM

## 2022-03-14 DIAGNOSIS — G8929 Other chronic pain: Secondary | ICD-10-CM

## 2022-03-14 DIAGNOSIS — M545 Low back pain, unspecified: Secondary | ICD-10-CM | POA: Insufficient documentation

## 2022-03-14 DIAGNOSIS — Z8619 Personal history of other infectious and parasitic diseases: Secondary | ICD-10-CM

## 2022-03-14 DIAGNOSIS — M47816 Spondylosis without myelopathy or radiculopathy, lumbar region: Secondary | ICD-10-CM | POA: Diagnosis not present

## 2022-03-14 MED ORDER — CYCLOBENZAPRINE HCL 10 MG PO TABS: 10.0000 mg | ORAL_TABLET | Freq: Three times a day (TID) | ORAL | 1 refills | Status: DC | PRN

## 2022-03-14 MED ORDER — VALACYCLOVIR HCL 500 MG PO TABS: ORAL_TABLET | ORAL | 3 refills | Status: DC

## 2022-03-14 NOTE — Patient Instructions (Signed)
Start flexeril Do not drive or operate heavy machinery while on muscle relaxant. Please do not drink alcohol. Only take this medication as needed for acute muscle spasm at bedtime. This medication make you feel drowsy so be very careful.  Stop taking if become too drowsy or somnolent as this puts you at risk for falls. Please contact our office with any questions.   Low Back Sprain or Strain Rehab Ask your health care provider which exercises are safe for you. Do exercises exactly as told by your health care provider and adjust them as directed. It is normal to feel mild stretching, pulling, tightness, or discomfort as you do these exercises. Stop right away if you feel sudden pain or your pain gets worse. Do not begin these exercises until told by your health care provider. Stretching and range-of-motion exercises These exercises warm up your muscles and joints and improve the movement and flexibility of your back. These exercises also help to relieve pain, numbness, and tingling. Lumbar rotation  Lie on your back on a firm bed or the floor with your knees bent. Straighten your arms out to your sides so each arm forms a 90-degree angle (right angle) with a side of your body. Slowly move (rotate) both of your knees to one side of your body until you feel a stretch in your lower back (lumbar). Try not to let your shoulders lift off the floor. Hold this position for __________ seconds. Tense your abdominal muscles and slowly move your knees back to the starting position. Repeat this exercise on the other side of your body. Repeat __________ times. Complete this exercise __________ times a day. Single knee to chest  Lie on your back on a firm bed or the floor with both legs straight. Bend one of your knees. Use your hands to move your knee up toward your chest until you feel a gentle stretch in your lower back and buttock. Hold your leg in this position by holding on to the front of your knee. Keep  your other leg as straight as possible. Hold this position for __________ seconds. Slowly return to the starting position. Repeat with your other leg. Repeat __________ times. Complete this exercise __________ times a day. Prone extension on elbows  Lie on your abdomen on a firm bed or the floor (prone position). Prop yourself up on your elbows. Use your arms to help lift your chest up until you feel a gentle stretch in your abdomen and your lower back. This will place some of your body weight on your elbows. If this is uncomfortable, try stacking pillows under your chest. Your hips should stay down, against the surface that you are lying on. Keep your hip and back muscles relaxed. Hold this position for __________ seconds. Slowly relax your upper body and return to the starting position. Repeat __________ times. Complete this exercise __________ times a day. Strengthening exercises These exercises build strength and endurance in your back. Endurance is the ability to use your muscles for a long time, even after they get tired. Pelvic tilt This exercise strengthens the muscles that lie deep in the abdomen. Lie on your back on a firm bed or the floor with your legs extended. Bend your knees so they are pointing toward the ceiling and your feet are flat on the floor. Tighten your lower abdominal muscles to press your lower back against the floor. This motion will tilt your pelvis so your tailbone points up toward the ceiling instead of pointing  to your feet or the floor. To help with this exercise, you may place a small towel under your lower back and try to push your back into the towel. Hold this position for __________ seconds. Let your muscles relax completely before you repeat this exercise. Repeat __________ times. Complete this exercise __________ times a day. Alternating arm and leg raises  Get on your hands and knees on a firm surface. If you are on a hard floor, you may want to  use padding, such as an exercise mat, to cushion your knees. Line up your arms and legs. Your hands should be directly below your shoulders, and your knees should be directly below your hips. Lift your left leg behind you. At the same time, raise your right arm and straighten it in front of you. Do not lift your leg higher than your hip. Do not lift your arm higher than your shoulder. Keep your abdominal and back muscles tight. Keep your hips facing the ground. Do not arch your back. Keep your balance carefully, and do not hold your breath. Hold this position for __________ seconds. Slowly return to the starting position. Repeat with your right leg and your left arm. Repeat __________ times. Complete this exercise __________ times a day. Abdominal set with straight leg raise  Lie on your back on a firm bed or the floor. Bend one of your knees and keep your other leg straight. Tense your abdominal muscles and lift your straight leg up, 4-6 inches (10-15 cm) off the ground. Keep your abdominal muscles tight and hold this position for __________ seconds. Do not hold your breath. Do not arch your back. Keep it flat against the ground. Keep your abdominal muscles tense as you slowly lower your leg back to the starting position. Repeat with your other leg. Repeat __________ times. Complete this exercise __________ times a day. Single leg lower with bent knees Lie on your back on a firm bed or the floor. Tense your abdominal muscles and lift your feet off the floor, one foot at a time, so your knees and hips are bent in 90-degree angles (right angles). Your knees should be over your hips and your lower legs should be parallel to the floor. Keeping your abdominal muscles tense and your knee bent, slowly lower one of your legs so your toe touches the ground. Lift your leg back up to return to the starting position. Do not hold your breath. Do not let your back arch. Keep your back flat against  the ground. Repeat with your other leg. Repeat __________ times. Complete this exercise __________ times a day. Posture and body mechanics Good posture and healthy body mechanics can help to relieve stress in your body's tissues and joints. Body mechanics refers to the movements and positions of your body while you do your daily activities. Posture is part of body mechanics. Good posture means: Your spine is in its natural S-curve position (neutral). Your shoulders are pulled back slightly. Your head is not tipped forward (neutral). Follow these guidelines to improve your posture and body mechanics in your everyday activities. Standing  When standing, keep your spine neutral and your feet about hip-width apart. Keep a slight bend in your knees. Your ears, shoulders, and hips should line up. When you do a task in which you stand in one place for a long time, place one foot up on a stable object that is 2-4 inches (5-10 cm) high, such as a footstool. This helps keep your  spine neutral. Sitting  When sitting, keep your spine neutral and keep your feet flat on the floor. Use a footrest, if necessary, and keep your thighs parallel to the floor. Avoid rounding your shoulders, and avoid tilting your head forward. When working at a desk or a computer, keep your desk at a height where your hands are slightly lower than your elbows. Slide your chair under your desk so you are close enough to maintain good posture. When working at a computer, place your monitor at a height where you are looking straight ahead and you do not have to tilt your head forward or downward to look at the screen. Resting When lying down and resting, avoid positions that are most painful for you. If you have pain with activities such as sitting, bending, stooping, or squatting, lie in a position in which your body does not bend very much. For example, avoid curling up on your side with your arms and knees near your chest (fetal  position). If you have pain with activities such as standing for a long time or reaching with your arms, lie with your spine in a neutral position and bend your knees slightly. Try the following positions: Lying on your side with a pillow between your knees. Lying on your back with a pillow under your knees. Lifting  When lifting objects, keep your feet at least shoulder-width apart and tighten your abdominal muscles. Bend your knees and hips and keep your spine neutral. It is important to lift using the strength of your legs, not your back. Do not lock your knees straight out. Always ask for help to lift heavy or awkward objects. This information is not intended to replace advice given to you by your health care provider. Make sure you discuss any questions you have with your health care provider. Document Revised: 03/15/2020 Document Reviewed: 03/15/2020 [employer] Patient Education  [employer].

## 2022-03-14 NOTE — Assessment & Plan Note (Signed)
Acute on chronic.  Previous x-ray 10/2017 without degenerative disc disease.  Pending lumbar x-ray for comparison and due to chronic nature of pain.  Patient to start Flexeril 10 mg 3 times daily as needed.  Referral to physical therapy.  Consider gabapentin

## 2022-03-14 NOTE — Progress Notes (Signed)
Assessment & Plan:  Chronic midline low back pain with left-sided sciatica Assessment & Plan: Acute on chronic.  Previous x-ray 10/2017 without degenerative disc disease.  Pending lumbar x-ray for comparison and due to chronic nature of pain.  Patient to start Flexeril 10 mg 3 times daily as needed.  Referral to physical therapy.  Consider gabapentin  Orders: -     DG Lumbar Spine Complete; Future -     Cyclobenzaprine HCl; Take 1 tablet (10 mg total) by mouth 3 (three) times daily as needed for muscle spasms.  Dispense: 30 tablet; Refill: 1 -     Ambulatory referral to Physical Therapy  History of cold sores -     valACYclovir HCl; Take 2000 mh po q12 x 1 day; start ASAP after sx onset  Dispense: 30 tablet; Refill: 3     Return precautions given.   Risks, benefits, and alternatives of the medications and treatment plan prescribed today were discussed, and patient expressed understanding.   Education regarding symptom management and diagnosis given to patient on AVS either electronically or printed.  No follow-ups on file.  [name], FNP  Subjective:    Patient ID: Stephanie Lyons, female    DOB: January 02, 1981, 42 y.o.   MRN: [id number]  CC: Stephanie Lyons is a 42 y.o. female who presents today for an acute visit.    HPI: Complains of low back pain x 6 months, worsened the past couple of weeks.   She endorses left leg occasional numbness when lays on left side; when on right side, no numbness.   Suspects bending at work has contributed.  No falls, trauma  No neck pain, weakness, saddle anesthesia, trouble urinating, abdominal pain, fecal incontinence, dysuria  She is using tylenol '1000mg'$  with some relief.  In the past she has been on gabapentin however never found this to be particularly effective.   LMP 03/07/22  History of shoulder surgery, CTS No history of cancer Allergies: Dust mite extract, Morphine and related, Percocet [oxycodone-acetaminophen],  Prednisone, and Kiwi extract Current Outpatient Medications on File Prior to Visit  Medication Sig Dispense Refill   albuterol (VENTOLIN HFA) 108 (90 Base) MCG/ACT inhaler Inhale 2 puffs into the lungs every 6 (six) hours as needed for wheezing or shortness of breath. 18 g 3   APPLE CIDER VINEGAR PO Take 2 capsules by mouth in the morning and at bedtime.     ibuprofen (ADVIL) 200 MG tablet Take 200 mg by mouth daily as needed for headache or moderate pain.      Multiple Vitamins-Minerals (MULTIVITAMIN WITH MINERALS) tablet Take 1 tablet by mouth daily. Takes two gummies every morning.     No current facility-administered medications on file prior to visit.    Review of Systems  Constitutional:  Negative for chills and fever.  Respiratory:  Negative for cough.   Cardiovascular:  Negative for chest pain and palpitations.  Gastrointestinal:  Negative for nausea and vomiting.  Genitourinary:  Negative for difficulty urinating.  Musculoskeletal:  Positive for back pain.  Neurological:  Positive for numbness.      Objective:    BP 122/82   Pulse 76   Temp 97.7 F (36.5 C) (Oral)   Ht '5\' 2"'$  (1.575 m)   Wt 145 lb 6.4 oz (66 kg)   SpO2 98%   BMI 26.59 kg/m   BP Readings from Last 3 Encounters:  03/14/22 122/82  09/14/20 (!) 102/53  01/28/20 108/60   Wt Readings from Last 3 Encounters:  03/14/22 145 lb 6.4 oz (66 kg)  09/14/20 134 lb (60.8 kg)  01/28/20 118 lb 9.6 oz (53.8 kg)    Physical Exam Vitals reviewed.  Constitutional:      Appearance: She is well-developed.  Eyes:     Conjunctiva/sclera: Conjunctivae normal.  Cardiovascular:     Rate and Rhythm: Normal rate and regular rhythm.     Pulses: Normal pulses.     Heart sounds: Normal heart sounds.  Pulmonary:     Effort: Pulmonary effort is normal.     Breath sounds: Normal breath sounds. No wheezing, rhonchi or rales.  Musculoskeletal:     Lumbar back: Tenderness present. No swelling, edema, spasms or bony  tenderness. Normal range of motion. Negative right straight leg raise test and negative left straight leg raise test.       Back:     Comments: Tenderness at midline low back as marked on diagram. Full range of motion with flexion, tension, lateral side bends. No bony tenderness. No pain, numbness, tingling elicited with single leg raise bilaterally.   Skin:    General: Skin is warm and dry.  Neurological:     Mental Status: She is alert.     Sensory: No sensory deficit.     Deep Tendon Reflexes:     Reflex Scores:      Patellar reflexes are 2+ on the right side and 2+ on the left side.    Comments: Sensation and strength intact bilateral lower extremities.  Psychiatric:        Speech: Speech normal.        Behavior: Behavior normal.        Thought Content: Thought content normal.

## 2022-05-01 ENCOUNTER — Encounter: Payer: Self-pay | Admitting: Family

## 2022-05-01 ENCOUNTER — Other Ambulatory Visit: Payer: Self-pay | Admitting: Family

## 2022-05-01 DIAGNOSIS — Z8619 Personal history of other infectious and parasitic diseases: Secondary | ICD-10-CM

## 2022-05-07 ENCOUNTER — Other Ambulatory Visit: Payer: Self-pay | Admitting: Family

## 2022-05-07 DIAGNOSIS — Z8619 Personal history of other infectious and parasitic diseases: Secondary | ICD-10-CM

## 2022-05-07 DIAGNOSIS — B001 Herpesviral vesicular dermatitis: Secondary | ICD-10-CM

## 2022-07-03 ENCOUNTER — Encounter: Payer: Self-pay | Admitting: Family

## 2022-07-05 ENCOUNTER — Other Ambulatory Visit: Payer: Self-pay | Admitting: Family

## 2022-07-05 DIAGNOSIS — Z8619 Personal history of other infectious and parasitic diseases: Secondary | ICD-10-CM

## 2022-07-05 MED ORDER — VALACYCLOVIR HCL 1 G PO TABS
ORAL_TABLET | ORAL | 1 refills | Status: DC
Start: 2022-07-05 — End: 2022-09-04

## 2022-09-03 ENCOUNTER — Other Ambulatory Visit: Payer: Self-pay | Admitting: Family

## 2022-09-03 DIAGNOSIS — Z8619 Personal history of other infectious and parasitic diseases: Secondary | ICD-10-CM

## 2022-09-04 ENCOUNTER — Encounter: Payer: Self-pay | Admitting: Family

## 2022-09-04 ENCOUNTER — Other Ambulatory Visit: Payer: Self-pay

## 2022-09-04 DIAGNOSIS — Z8619 Personal history of other infectious and parasitic diseases: Secondary | ICD-10-CM

## 2022-09-04 MED ORDER — VALACYCLOVIR HCL 1 G PO TABS
ORAL_TABLET | ORAL | 1 refills | Status: DC
Start: 2022-09-04 — End: 2022-09-06

## 2022-09-04 NOTE — Telephone Encounter (Signed)
Rx sent pt is aware 

## 2022-09-04 NOTE — Telephone Encounter (Signed)
Medication was discontinued on 03/14/2022. Is it okay to refuse refill request?

## 2022-09-04 NOTE — Telephone Encounter (Signed)
Refill sent pt is aware. 

## 2022-09-06 ENCOUNTER — Encounter: Payer: Self-pay | Admitting: Family

## 2022-09-06 ENCOUNTER — Other Ambulatory Visit: Payer: Self-pay | Admitting: Family

## 2022-09-06 ENCOUNTER — Telehealth: Payer: Self-pay

## 2022-09-06 DIAGNOSIS — Z8619 Personal history of other infectious and parasitic diseases: Secondary | ICD-10-CM

## 2022-09-06 MED ORDER — VALACYCLOVIR HCL 1 G PO TABS
1000.0000 mg | ORAL_TABLET | Freq: Every day | ORAL | 3 refills | Status: DC
Start: 2022-09-06 — End: 2022-12-28

## 2022-09-06 NOTE — Telephone Encounter (Signed)
Patient states she is at the pharmacy now and wanted to see if we received her message.  Patient states she would like to know if we can go ahead and send the corrected prescriptions since she is there right now.  Patient states the medication is valACYclovir (VALTREX) 1000 MG tablet.  I spoke with Jenate [place], CMA, and she states she will send it.  I relayed message to patient.

## 2022-11-07 NOTE — Telephone Encounter (Signed)
Error

## 2022-12-27 ENCOUNTER — Encounter: Payer: Self-pay | Admitting: Family

## 2022-12-27 NOTE — Telephone Encounter (Signed)
Scheduled her to see [facility] tomorrow

## 2022-12-28 ENCOUNTER — Other Ambulatory Visit: Payer: Self-pay

## 2022-12-28 ENCOUNTER — Encounter: Payer: Self-pay | Admitting: Family

## 2022-12-28 ENCOUNTER — Telehealth: Payer: Self-pay

## 2022-12-28 ENCOUNTER — Telehealth: Payer: BC Managed Care – PPO | Admitting: Nurse Practitioner

## 2022-12-28 VITALS — BP 102/67 | Temp 100.6°F | Wt 145.0 lb

## 2022-12-28 DIAGNOSIS — J0100 Acute maxillary sinusitis, unspecified: Secondary | ICD-10-CM | POA: Diagnosis not present

## 2022-12-28 DIAGNOSIS — R6889 Other general symptoms and signs: Secondary | ICD-10-CM

## 2022-12-28 DIAGNOSIS — Z1152 Encounter for screening for COVID-19: Secondary | ICD-10-CM | POA: Diagnosis not present

## 2022-12-28 DIAGNOSIS — Z8619 Personal history of other infectious and parasitic diseases: Secondary | ICD-10-CM

## 2022-12-28 LAB — POC COVID19 BINAXNOW: SARS Coronavirus 2 Ag: NEGATIVE

## 2022-12-28 LAB — POCT INFLUENZA A/B
Influenza A, POC: NEGATIVE
Influenza B, POC: NEGATIVE

## 2022-12-28 MED ORDER — PSEUDOEPH-BROMPHEN-DM 30-2-10 MG/5ML PO SYRP: 5.0000 mL | ORAL_SOLUTION | Freq: Four times a day (QID) | ORAL | 0 refills | Status: DC | PRN

## 2022-12-28 MED ORDER — VALACYCLOVIR HCL 1 G PO TABS: 1000.0000 mg | ORAL_TABLET | Freq: Every day | ORAL | 3 refills | Status: DC

## 2022-12-28 MED ORDER — AMOXICILLIN-POT CLAVULANATE 875-125 MG PO TABS: 1.0000 | ORAL_TABLET | Freq: Two times a day (BID) | ORAL | 0 refills | Status: DC

## 2022-12-28 NOTE — Assessment & Plan Note (Signed)
They have a history of cold sores with fever and illness and are currently on prophylactic Valacyclovir. We will continue Valacyclovir 1000mg  daily. Refills sent.

## 2022-12-28 NOTE — Telephone Encounter (Signed)
Called pt to see if she is okay with changing her 4pm in person visit into a virtual. Pt agreed and I informed her she can come by the office to get swabbed for Covid and flu.    Pt was advised to send mychart msg once she is around back of the building.

## 2022-12-28 NOTE — Progress Notes (Signed)
MyChart Video Visit    Virtual Visit via Video Note   This visit type was conducted because this format is felt to be most appropriate for this patient at this time. Physical exam was limited by quality of the video and audio technology used for the visit. CMA was able to get the patient set up on a video visit.  Patient location: Home. Patient and provider in visit Provider location: Office  I discussed the limitations of evaluation and management by telemedicine and the availability of in person appointments. The patient expressed understanding and agreed to proceed.  Visit Date: 12/28/2022  Today's healthcare provider: Bethanie Dicker, NP     Subjective:    Patient ID: Stephanie Lyons, female    DOB: Apr 29, 1980, 42 y.o.   MRN: [id number]  Chief Complaint  Patient presents with   Nasal Congestion    With eye pain, sore throat, body aches, fatigue, loss appetite, cough  Sxs started: Monday night  Okay with getting a chest xray if necessary and okay with going to [facility]     HPI  Discussed the use of AI scribe software for clinical note transcription with the patient, who gave verbal consent to proceed.  History of Present Illness   The patient, with a recent exposure to family members diagnosed with bronchitis and pneumonia, presents with symptoms suggestive of a sinus infection. The onset of symptoms was approximately four days prior to the consultation, beginning with a sensation of burning in the nose and facial pain, particularly behind the eyes. Over the last 24 hours, the patient's condition worsened, with the development of a low-grade fever fluctuating between 98.4 and 100.6 degrees Fahrenheit, a sore throat, a productive and dry cough, body aches, and a loss of appetite. The patient also reports significant congestion, with facial pain likened to being hit with a two-by-four, and occasional chest tightness.  The patient has been managing symptoms with over-the-counter  Mucinex, but reports worsening symptoms at night, to the point of preferring to sit up rather than lie down. 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  The patient works in [employer] and has concerns about spreading the infection to others, particularly older family members. The patient is seeking medical intervention to manage the symptoms and prevent the condition from worsening into bronchitis or pneumonia.      Past Medical History:  Diagnosis Date   Arthritis    Asthma    Carpal tunnel syndrome, right    Chicken pox    Complication of anesthesia    Depression    Family history of adverse reaction to anesthesia    brother becomes aggressive   GERD (gastroesophageal reflux disease)    rare   Neck pain    PONV (postoperative nausea and vomiting)    PT STATES AS LONG AS ANTIEMETIC GIVEN THRU IV SHE DOES WELL AND DOES NOT GET SICK-HAS NEVER HAD SCOPALAMINE PATCH    Past Surgical History:  Procedure Laterality Date   CARPAL TUNNEL RELEASE Right 04/02/2015   Procedure: OPEN CARPAL TUNNEL RELEASE;  Surgeon: Erin Sons, MD;  Location: [facility];  Service: Orthopedics;  Laterality: Right;   CARPAL TUNNEL RELEASE Left 07/09/2019   Procedure: CARPAL TUNNEL RELEASE ENDOSCOPIC WITH EXCISION OF LEFT DORAL CARPAL GANGLION;  Surgeon: Christena Flake, MD;  Location: [facility];  Service: Orthopedics;  Laterality: Left;   FOOT SURGERY  Right    HAND SURGERY     R- hand 2019, L-hand (2x) 2019   left foot surgery     SHOULDER SURGERY     TONSILLECTOMY  2012    Family History  Problem Relation Age of Onset   Hypertension Mother    Hemochromatosis Mother    Hypertension Maternal Grandmother    Hypertension Maternal Grandfather     Social History   Socioeconomic History   Marital status: Single    Spouse name: Not on file   Number of children: Not on file   Years of education:  Not on file   Highest education level: Not on file  Occupational History   Not on file  Tobacco Use   Smoking status: Former    Current packs/day: 0.00    Average packs/day: 0.3 packs/day for 5.0 years (1.5 ttl pk-yrs)    Types: Cigarettes    Start date: 01/14/2015    Quit date: 01/14/2020    Years since quitting: 2.9   Smokeless tobacco: Never  Vaping Use   Vaping status: Never Used  Substance and Sexual Activity   Alcohol use: Not Currently    Alcohol/week: 8.0 standard drinks of alcohol    Types: 4 Glasses of wine, 4 Cans of beer per week   Drug use: Never   Sexual activity: Yes    Partners: Female  Other Topics Concern   Not on file  Social History Narrative   Has Fiance      Works as Production designer, 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 No further alcohol use.    Social Drivers of Corporate investment banker Strain: Not on file  Food Insecurity: Not on file  Transportation Needs: Not on file  Physical Activity: Not on file  Stress: Not on file  Social Connections: Not on file  Intimate Partner Violence: Not on file    Outpatient Medications Prior to Visit  Medication Sig Dispense Refill   albuterol (VENTOLIN HFA) 108 (90 Base) MCG/ACT inhaler Inhale 2 puffs into the lungs every 6 (six) hours as needed for wheezing or shortness of breath. 18 g 3   Multiple Vitamins-Minerals (MULTIVITAMIN WITH MINERALS) tablet Take 1 tablet by mouth daily. Takes two gummies every morning.     valACYclovir (VALTREX) 1000 MG tablet Take 1 tablet (1,000 mg total) by mouth daily. 90 tablet 3   APPLE CIDER VINEGAR PO Take 2 capsules by mouth in the morning and at bedtime.     ibuprofen (ADVIL) 200 MG tablet Take 200 mg by mouth daily as needed for headache or moderate pain.  (Patient not taking: Reported on 12/28/2022)     cyclobenzaprine (FLEXERIL) 10 MG tablet Take 1 tablet (10 mg total) by mouth 3 (three) times daily as needed for muscle spasms. 30  tablet 1   No facility-administered medications prior to visit.    Allergies  Allergen Reactions   Dust Mite Extract Other (See Comments)    Allergy headache   Morphine     Other Reaction(s): Not available   Morphine And Codeine Other (See Comments)    Hypotension   Percocet [Oxycodone-Acetaminophen] Nausea And Vomiting   Prednisone Other (See Comments)    Give her bad thoughts  Other Reaction(s): hallucinations   Kiwi Extract Hives    ROS See HPI    Objective:    Physical Exam  BP 102/67 Comment: pt reported  Temp (!) 100.6 F (38.1  C) Comment: Pt reported  Wt 145 lb (65.8 kg)   BMI 26.52 kg/m  Wt Readings from Last 3 Encounters:  12/28/22 145 lb (65.8 kg)  03/14/22 145 lb 6.4 oz (66 kg)  09/14/20 134 lb (60.8 kg)   GENERAL: alert, oriented, appears well and in no acute distress   HEENT: atraumatic, conjunttiva clear, no obvious abnormalities on inspection of external nose and ears   NECK: normal movements of the head and neck   LUNGS: on inspection no signs of respiratory distress, breathing rate appears normal, no obvious gross SOB, gasping or wheezing   CV: no obvious cyanosis   MS: moves all visible extremities without noticeable abnormality   PSYCH/NEURO: pleasant and cooperative, no obvious depression or anxiety, speech and thought processing grossly intact     Assessment & Plan:   Problem List Items Addressed This Visit       Respiratory   Acute non-recurrent maxillary sinusitis - Primary   They exhibit symptoms of facial pain, congestion, postnasal drip, and tenderness under the eyes, with recent exposure to family members with bronchitis and pneumonia but are negative for COVID and flu. Symptoms consistent with sinusitis. We will start Augmentin 875 mg BID x 10 days. Cough medicine will be prescribed to use as needed, with a higher dose at bedtime for symptomatic relief. We advise ensuring adequate hydration and rest, continuing Tylenol/Ibuprofen  as needed for fever and headaches. They can return to work on Monday, 01/01/2023, return precautions given to patient.       Relevant Medications   valACYclovir (VALTREX) 1000 MG tablet   brompheniramine-pseudoephedrine-DM 30-2-10 MG/5ML syrup   amoxicillin-clavulanate (AUGMENTIN) 875-125 MG tablet     Other   History of cold sores   They have a history of cold sores with fever and illness and are currently on prophylactic Valacyclovir. We will continue Valacyclovir 1000mg  daily. Refills sent.       Relevant Medications   valACYclovir (VALTREX) 1000 MG tablet   Other Visit Diagnoses       Flu-like symptoms         Encounter for screening for COVID-19           I have discontinued Lety Meares "Mo"'s cyclobenzaprine. I am also having her start on brompheniramine-pseudoephedrine-DM and amoxicillin-clavulanate. Additionally, I am having her maintain her albuterol, multivitamin with minerals, ibuprofen, APPLE CIDER VINEGAR PO, and valACYclovir.  Meds ordered this encounter  Medications   valACYclovir (VALTREX) 1000 MG tablet    Sig: Take 1 tablet (1,000 mg total) by mouth daily.    Dispense:  90 tablet    Refill:  3   brompheniramine-pseudoephedrine-DM 30-2-10 MG/5ML syrup    Sig: Take 5-10 mLs by mouth every 6 (six) hours as needed.    Dispense:  120 mL    Refill:  0    Supervising Provider:   Birdie Sons, ERIC G [[id number]]   amoxicillin-clavulanate (AUGMENTIN) 875-125 MG tablet    Sig: Take 1 tablet by mouth 2 (two) times daily.    Dispense:  20 tablet    Refill:  0    Supervising Provider:   Birdie Sons, ERIC G [[id number]]   I discussed the assessment and treatment plan with the patient. The patient was provided an opportunity to ask questions and all were answered. The patient agreed with the plan and demonstrated an understanding of the instructions.   The patient was advised to call back or seek an in-person evaluation if the symptoms worsen or if the condition fails  to  improve as anticipated.   Bethanie Dicker, NP [facility] [phone] (phone) [phone] (fax)  [facility]

## 2022-12-28 NOTE — Assessment & Plan Note (Addendum)
They exhibit symptoms of facial pain, congestion, postnasal drip, and tenderness under the eyes, with recent exposure to family members with bronchitis and pneumonia but are negative for COVID and flu. Symptoms consistent with sinusitis. We will start Augmentin 875 mg BID x 10 days. Cough medicine will be prescribed to use as needed, with a higher dose at bedtime for symptomatic relief. We advise ensuring adequate hydration and rest, continuing Tylenol/Ibuprofen as needed for fever and headaches. They can return to work on Monday, 01/01/2023, work note provided. Return precautions given to patient.

## 2023-04-13 ENCOUNTER — Encounter: Payer: Self-pay | Admitting: Family

## 2023-04-13 DIAGNOSIS — Z0182 Encounter for allergy testing: Secondary | ICD-10-CM

## 2023-05-15 ENCOUNTER — Telehealth: Payer: Self-pay | Admitting: Oncology

## 2023-05-15 ENCOUNTER — Encounter: Payer: Self-pay | Admitting: Family

## 2023-05-15 NOTE — Telephone Encounter (Signed)
 Patient called and was wanting to see Dr. Wilhelmenia Harada. It looks like the last time she saw Dr. Wilhelmenia Harada was in 2022. I told her I would reach out to the team and we would give her a call once we knew how to schedule. Please advise

## 2023-05-16 NOTE — Telephone Encounter (Signed)
 Spoke to pt and she would like to re-establish care to continue hemochromatosis management.   Pt aware of appts, please schedule:   6/5 - MD (10:45am) followed by labs

## 2023-06-14 ENCOUNTER — Encounter: Payer: Self-pay | Admitting: Oncology

## 2023-06-14 ENCOUNTER — Inpatient Hospital Stay: Attending: Oncology | Admitting: Oncology

## 2023-06-14 ENCOUNTER — Inpatient Hospital Stay

## 2023-06-14 VITALS — BP 112/65 | HR 90 | Temp 97.8°F | Resp 20 | Wt 138.5 lb

## 2023-06-14 DIAGNOSIS — D751 Secondary polycythemia: Secondary | ICD-10-CM | POA: Diagnosis not present

## 2023-06-14 DIAGNOSIS — Z148 Genetic carrier of other disease: Secondary | ICD-10-CM | POA: Insufficient documentation

## 2023-06-14 DIAGNOSIS — Z87891 Personal history of nicotine dependence: Secondary | ICD-10-CM | POA: Diagnosis not present

## 2023-06-14 LAB — IRON AND TIBC
Iron: 152 ug/dL (ref 28–170)
Saturation Ratios: 45 % — ABNORMAL HIGH (ref 10.4–31.8)
TIBC: 336 ug/dL (ref 250–450)
UIBC: 184 ug/dL

## 2023-06-14 LAB — COMPREHENSIVE METABOLIC PANEL WITH GFR
ALT: 12 U/L (ref 0–44)
AST: 14 U/L — ABNORMAL LOW (ref 15–41)
Albumin: 3.8 g/dL (ref 3.5–5.0)
Alkaline Phosphatase: 40 U/L (ref 38–126)
Anion gap: 7 (ref 5–15)
BUN: 10 mg/dL (ref 6–20)
CO2: 23 mmol/L (ref 22–32)
Calcium: 8.3 mg/dL — ABNORMAL LOW (ref 8.9–10.3)
Chloride: 104 mmol/L (ref 98–111)
Creatinine, Ser: 0.57 mg/dL (ref 0.44–1.00)
GFR, Estimated: >60 mL/min (ref >60)
Glucose, Bld: 89 mg/dL (ref 70–99)
Potassium: 4.2 mmol/L (ref 3.5–5.1)
Sodium: 134 mmol/L — ABNORMAL LOW (ref 135–145)
Total Bilirubin: 0.6 mg/dL (ref 0.0–1.2)
Total Protein: 6.9 g/dL (ref 6.5–8.1)

## 2023-06-14 LAB — CBC WITH DIFFERENTIAL/PLATELET
Abs Immature Granulocytes: 0.04 10*3/uL (ref 0.00–0.07)
Basophils Absolute: 0.1 10*3/uL (ref 0.0–0.1)
Basophils Relative: 1 %
Eosinophils Absolute: 0.2 10*3/uL (ref 0.0–0.5)
Eosinophils Relative: 3 %
HCT: 45.1 % (ref 36.0–46.0)
Hemoglobin: 15.3 g/dL — ABNORMAL HIGH (ref 12.0–15.0)
Immature Granulocytes: 1 %
Lymphocytes Relative: 27 %
Lymphs Abs: 2.1 10*3/uL (ref 0.7–4.0)
MCH: 32.6 pg (ref 26.0–34.0)
MCHC: 33.9 g/dL (ref 30.0–36.0)
MCV: 96 fL (ref 80.0–100.0)
Monocytes Absolute: 0.8 10*3/uL (ref 0.1–1.0)
Monocytes Relative: 10 %
Neutro Abs: 4.7 10*3/uL (ref 1.7–7.7)
Neutrophils Relative %: 58 %
Platelets: 273 10*3/uL (ref 150–400)
RBC: 4.7 MIL/uL (ref 3.87–5.11)
RDW: 12.9 % (ref 11.5–15.5)
WBC: 8 10*3/uL (ref 4.0–10.5)
nRBC: 0 % (ref 0.0–0.2)

## 2023-06-14 LAB — FERRITIN: Ferritin: 33 ng/mL (ref 11–307)

## 2023-06-14 NOTE — Progress Notes (Signed)
 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 Lab Results  Component Value Date   HGB 15.3 (H) 06/14/2023   TIBC 336 06/14/2023   IRONPCTSAT 45 (H) 06/14/2023   FERRITIN 33 06/14/2023   Iron panel shows slightly increased iron saturation.  Normal ferritin.  No need for phlebotomy. Recommend patient to avoid alcohol, vitamin C, iron supplementation.   Erythrocytosis Mildly elevated hemoglobin.  Dehydration versus erythrocytosis. No need for phlebotomy.  Recommend observation.  If persist, will trigger erythrocytosis workup.  Hypocalcemia Recommend patient to take calcium and vitamin D supplementation.   Orders Placed This Encounter  Procedures   Ferritin    Standing Status:   Future    Number of Occurrences:   1    Expected Date:   06/14/2023    Expiration Date:   12/14/2023   Iron and TIBC    Standing Status:   Future    Number of Occurrences:   1    Expected Date:   06/14/2023    Expiration Date:   06/13/2024   CBC with Differential/Platelet    Standing Status:   Future    Number of Occurrences:   1    Expected Date:   06/14/2023    Expiration Date:   06/13/2024   Comprehensive metabolic panel with GFR    Standing Status:   Future    Number of Occurrences:   1    Expected Date:   06/14/2023    Expiration Date:   06/13/2024   CBC with Differential (Cancer Center Only)    Standing Status:   Future    Expected Date:   06/13/2024    Expiration Date:   06/13/2024   Iron and TIBC    Standing Status:   Future    Expected Date:   06/13/2024    Expiration Date:   06/13/2024   Ferritin    Standing Status:   Future    Expected Date:   06/13/2024    Expiration Date:   06/13/2024   Hepatic function panel    Standing Status:   Future     Expected Date:   06/13/2024    Expiration Date:   06/13/2024   Follow-up in 1 year. All questions were answered. The patient knows to call the clinic with any problems, questions or concerns.  Timmy Forbes, MD, PhD [facility] Oncology 06/14/2023   CHIEF COMPLAINTS/REASON FOR VISIT:   hemochromatosis carrier.  HISTORY OF PRESENTING ILLNESS:   Stephanie Lyons is a  43 y.o.  female with PMH listed below was seen in consultation at the request of  Calista Catching, FNP presents for hemochromatosis carrier  Patient reports that her mother was recently diagnosed with iron overload secondary to 2 copies of hemochromatosis gene mutation.  She is concerned and wanted to have checked.  Refer to hematology for further evaluation. Patient has had iron panel tested about 8 months ago and there is no signs of iron overload.   INTERVAL HISTORY Stephanie Lyons is a 43 y.o. female who has above history reviewed by me today presents for follow up visit for hemochromatosis carrier  Patient drinks alcohol once per week.  She has no new complaints.   Review of Systems  Constitutional:  Negative for appetite change, chills, fatigue  and fever.  HENT:   Negative for hearing loss and voice change.   Eyes:  Negative for eye problems.  Respiratory:  Negative for chest tightness and cough.   Cardiovascular:  Negative for chest pain.  Gastrointestinal:  Negative for abdominal distention, abdominal pain and blood in stool.  Endocrine: Negative for hot flashes.  Genitourinary:  Negative for difficulty urinating and frequency.   Musculoskeletal:  Negative for arthralgias.  Skin:  Negative for itching and rash.  Neurological:  Negative for extremity weakness.  Hematological:  Negative for adenopathy.  Psychiatric/Behavioral:  Negative for confusion.     MEDICAL HISTORY:  Past Medical History:  Diagnosis Date   Arthritis    Asthma    Carpal tunnel syndrome, right    Chicken pox    Complication  of anesthesia    Depression    Family history of adverse reaction to anesthesia    brother becomes aggressive   GERD (gastroesophageal reflux disease)    rare   Neck pain    PONV (postoperative nausea and vomiting)    PT STATES AS LONG AS ANTIEMETIC GIVEN THRU IV SHE DOES WELL AND DOES NOT GET SICK-HAS NEVER HAD SCOPALAMINE PATCH    SURGICAL HISTORY: Past Surgical History:  Procedure Laterality Date   CARPAL TUNNEL RELEASE Right 04/02/2015   Procedure: OPEN CARPAL TUNNEL RELEASE;  Surgeon: Josephus Nida, MD;  Location: [facility];  Service: Orthopedics;  Laterality: Right;   CARPAL TUNNEL RELEASE Left 07/09/2019   Procedure: CARPAL TUNNEL RELEASE ENDOSCOPIC WITH EXCISION OF LEFT DORAL CARPAL GANGLION;  Surgeon: Elner Hahn, MD;  Location: [facility];  Service: Orthopedics;  Laterality: Left;   FOOT SURGERY Right    HAND SURGERY     R- hand 2019, L-hand (2x) 2019   left foot surgery     SHOULDER SURGERY     TONSILLECTOMY  2012    SOCIAL HISTORY: Social History   Socioeconomic History   Marital status: Single    Spouse name: Not on file   Number of children: Not on file   Years of education: Not on file   Highest education level: Not on file  Occupational History   Not on file  Tobacco Use   Smoking status: Former    Current packs/day: 0.00    Average packs/day: 0.3 packs/day for 5.0 years (1.5 ttl pk-yrs)    Types: Cigarettes    Start date: 01/14/2015    Quit date: 01/14/2020    Years since quitting: 3.4   Smokeless tobacco: Never  Vaping Use   Vaping status: Never Used  Substance and Sexual Activity   Alcohol use: Not Currently    Alcohol/week: 8.0 standard drinks of alcohol    Types: 4 Glasses of wine, 4 Cans of beer per week   Drug use: Never   Sexual activity: Yes    Partners: Female  Other Topics Concern   Not on file  Social History Narrative   Has Fiance      Works as Production designer, 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 No further alcohol use.    Social Drivers of Corporate investment banker Strain: Not on file  Food Insecurity: Not on file  Transportation Needs: Not on file  Physical Activity: Not on file  Stress: Not on file  Social Connections: Not on file  Intimate Partner Violence: Not on file    FAMILY HISTORY: Family History  Problem Relation  Age of Onset   Hypertension Mother    Hemochromatosis Mother    Hypertension Maternal Grandmother    Hypertension Maternal Grandfather     ALLERGIES:  is allergic to dust mite extract, morphine, morphine and codeine, percocet [oxycodone-acetaminophen ], prednisone , and kiwi extract.  MEDICATIONS:  Current Outpatient Medications  Medication Sig Dispense Refill   albuterol  (VENTOLIN  HFA) 108 (90 Base) MCG/ACT inhaler Inhale 2 puffs into the lungs every 6 (six) hours as needed for wheezing or shortness of breath. 18 g 3   APPLE CIDER VINEGAR PO Take 2 capsules by mouth in the morning and at bedtime.     brompheniramine-pseudoephedrine-DM 30-2-10 MG/5ML syrup Take 5-10 mLs by mouth every 6 (six) hours as needed. 120 mL 0   Multiple Vitamins-Minerals (MULTIVITAMIN WITH MINERALS) tablet Take 1 tablet by mouth daily. Takes two gummies every morning.     valACYclovir  (VALTREX ) 1000 MG tablet Take 1 tablet (1,000 mg total) by mouth daily. 90 tablet 3   amoxicillin -clavulanate (AUGMENTIN ) 875-125 MG tablet Take 1 tablet by mouth 2 (two) times daily. (Patient not taking: Reported on 06/14/2023) 20 tablet 0   ibuprofen (ADVIL) 200 MG tablet Take 200 mg by mouth daily as needed for headache or moderate pain.  (Patient not taking: Reported on 12/28/2022)     No current facility-administered medications for this visit.     PHYSICAL EXAMINATION: ECOG PERFORMANCE STATUS: 0 - Asymptomatic Vitals:   06/14/23 1042  BP: 112/65  Pulse: 90  Resp: 20  Temp: 97.8 F (36.6 C)  SpO2: 100%   Filed Weights   06/14/23 1042  Weight: 138 lb 8 oz (62.8 kg)     Physical Exam Constitutional:      General: She is not in acute distress. HENT:     Head: Normocephalic and atraumatic.  Eyes:     General: No scleral icterus. Cardiovascular:     Rate and Rhythm: Normal rate and regular rhythm.     Heart sounds: Normal heart sounds.  Pulmonary:     Effort: Pulmonary effort is normal. No respiratory distress.     Breath sounds: Normal breath sounds. No wheezing.  Abdominal:     General: Bowel sounds are normal. There is no distension.     Palpations: Abdomen is soft.  Musculoskeletal:        General: No deformity. Normal range of motion.     Cervical back: Normal range of motion and neck supple.  Skin:    General: Skin is warm and dry.     Findings: No erythema or rash.  Neurological:     Mental Status: She is alert and oriented to person, place, and time. Mental status is at baseline.  Psychiatric:        Mood and Affect: Mood normal.     LABORATORY DATA:  I have reviewed the data as listed Lab Results  Component Value Date   WBC 8.0 06/14/2023   HGB 15.3 (H) 06/14/2023   HCT 45.1 06/14/2023   MCV 96.0 06/14/2023   PLT 273 06/14/2023   Recent Labs    06/14/23 1121  NA 134*  K 4.2  CL 104  CO2 23  GLUCOSE 89  BUN 10  CREATININE 0.57  CALCIUM 8.3*  GFRNONAA >60  PROT 6.9  ALBUMIN 3.8  AST 14*  ALT 12  ALKPHOS 40  BILITOT 0.6   Iron/TIBC/Ferritin/ %Sat    Component Value Date/Time   IRON 152 06/14/2023 1121   TIBC 336 06/14/2023 1121   FERRITIN  33 06/14/2023 1121   IRONPCTSAT 45 (H) 06/14/2023 1121      RADIOGRAPHIC STUDIES: I have personally reviewed the radiological images as listed and agreed with the findings in the report. No results found.

## 2023-06-14 NOTE — Assessment & Plan Note (Signed)
 Mildly elevated hemoglobin.  Dehydration versus erythrocytosis. No need for phlebotomy.  Recommend observation.  If persist, will trigger erythrocytosis workup.

## 2023-06-14 NOTE — Assessment & Plan Note (Signed)
 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 Lab Results  Component Value Date   HGB 15.3 (H) 06/14/2023   TIBC 336 06/14/2023   IRONPCTSAT 45 (H) 06/14/2023   FERRITIN 33 06/14/2023   Iron panel shows slightly increased iron saturation.  Normal ferritin.  No need for phlebotomy. Recommend patient to avoid alcohol, vitamin C, iron supplementation.

## 2023-06-14 NOTE — Assessment & Plan Note (Signed)
Recommend patient to take calcium and vitamin D supplementation.

## 2023-07-17 ENCOUNTER — Ambulatory Visit
Admission: RE | Admit: 2023-07-17 | Discharge: 2023-07-17 | Disposition: A | Discharge status: Home / Self Care | Attending: Chiropractor | Admitting: Chiropractor

## 2023-07-17 ENCOUNTER — Ambulatory Visit
Admission: RE | Admit: 2023-07-17 | Discharge: 2023-07-17 | Disposition: A | Discharge status: Home / Self Care | Source: Ambulatory Visit | Attending: Chiropractor | Admitting: Chiropractor

## 2023-07-17 ENCOUNTER — Other Ambulatory Visit: Payer: Self-pay | Admitting: Chiropractor

## 2023-07-17 DIAGNOSIS — M545 Low back pain, unspecified: Secondary | ICD-10-CM | POA: Diagnosis not present

## 2023-07-17 DIAGNOSIS — M5430 Sciatica, unspecified side: Secondary | ICD-10-CM | POA: Insufficient documentation

## 2023-07-17 DIAGNOSIS — M48061 Spinal stenosis, lumbar region without neurogenic claudication: Secondary | ICD-10-CM | POA: Diagnosis not present

## 2023-07-17 DIAGNOSIS — G8929 Other chronic pain: Secondary | ICD-10-CM | POA: Diagnosis not present

## 2023-07-17 DIAGNOSIS — M47816 Spondylosis without myelopathy or radiculopathy, lumbar region: Secondary | ICD-10-CM | POA: Diagnosis not present

## 2023-09-11 ENCOUNTER — Telehealth: Payer: Self-pay

## 2023-09-11 NOTE — Telephone Encounter (Signed)
 Copied from CRM [id number]. Topic: Referral - Question >> Sep 11, 2023  9:35 AM Chasity T wrote: Reason for CRM: Pt is calling because referral that was sent in April for allergies was not received and the clinic is requesting for it to be re faxed again.   Fax number: [phone] ATTN: [facility]

## 2023-11-05 DIAGNOSIS — J3089 Other allergic rhinitis: Secondary | ICD-10-CM | POA: Diagnosis not present

## 2023-11-05 DIAGNOSIS — J3081 Allergic rhinitis due to animal (cat) (dog) hair and dander: Secondary | ICD-10-CM | POA: Diagnosis not present

## 2023-11-05 DIAGNOSIS — J301 Allergic rhinitis due to pollen: Secondary | ICD-10-CM | POA: Diagnosis not present

## 2023-11-05 DIAGNOSIS — H1045 Other chronic allergic conjunctivitis: Secondary | ICD-10-CM | POA: Diagnosis not present

## 2024-01-11 ENCOUNTER — Encounter: Payer: Self-pay | Admitting: Family

## 2024-01-15 ENCOUNTER — Ambulatory Visit: Admitting: Family

## 2024-01-15 ENCOUNTER — Other Ambulatory Visit: Payer: Self-pay | Admitting: Family

## 2024-01-15 VITALS — BP 118/72 | HR 85 | Temp 98.2°F | Ht 62.0 in | Wt 143.0 lb

## 2024-01-15 DIAGNOSIS — J302 Other seasonal allergic rhinitis: Secondary | ICD-10-CM

## 2024-01-15 DIAGNOSIS — Z8619 Personal history of other infectious and parasitic diseases: Secondary | ICD-10-CM

## 2024-01-15 DIAGNOSIS — F32A Depression, unspecified: Secondary | ICD-10-CM | POA: Diagnosis not present

## 2024-01-15 DIAGNOSIS — F419 Anxiety disorder, unspecified: Secondary | ICD-10-CM

## 2024-01-15 MED ORDER — HYDROXYZINE HCL 10 MG PO TABS: 10.0000 mg | ORAL_TABLET | Freq: Two times a day (BID) | ORAL | 0 refills | Status: DC | PRN

## 2024-01-15 MED ORDER — ALPRAZOLAM 0.5 MG PO TABS: 0.5000 mg | ORAL_TABLET | Freq: Once | ORAL | 0 refills | Status: AC

## 2024-01-15 MED ORDER — VALACYCLOVIR HCL 1 G PO TABS: 1000.0000 mg | ORAL_TABLET | Freq: Every day | ORAL | 3 refills | Status: AC

## 2024-01-15 MED ORDER — ALBUTEROL SULFATE HFA 108 (90 BASE) MCG/ACT IN AERS: 2.0000 | INHALATION_SPRAY | Freq: Four times a day (QID) | RESPIRATORY_TRACT | 3 refills | Status: DC | PRN

## 2024-01-15 NOTE — Patient Instructions (Signed)
 Take xanax  ahead of pap smear and mammogram  Please me know if I can order your mammogram. Referral for ADHD testing.  I have also sent a prescription of Atarax  which is related to Benadryl.  This medication can be used as needed for anxiety.  It can be sedating

## 2024-01-15 NOTE — Progress Notes (Unsigned)
" ° °  Assessment & Plan:  There are no diagnoses linked to this encounter.   Return precautions given.   Risks, benefits, and alternatives of the medications and treatment plan prescribed today were discussed, and patient expressed understanding.   Education regarding symptom management and diagnosis given to patient on AVS either electronically or printed.  No follow-ups on file.  Rollene Northern, FNP  Subjective:    Patient ID: Stephanie Lyons, female    DOB: 1980/10/26, 44 y.o.   MRN: [id number]  CC: Stephanie Lyons is a 44 y.o. female who presents today for follow up.   HPI: HPI Here for medication follow-up.  Accompanied by fiance.   She would like a refill on Valtrex   She hasn't had to use the albuterol  in some time; she will use prn for allergies  She has taken   Allergies: Dust mite extract, Morphine, Morphine and codeine, Percocet [oxycodone-acetaminophen ], Prednisone , and Kiwi extract Medications Ordered Prior to Encounter[1]  Review of Systems    Objective:    There were no vitals taken for this visit. BP Readings from Last 3 Encounters:  06/14/23 112/65  12/28/22 102/67  03/14/22 122/82   Wt Readings from Last 3 Encounters:  06/14/23 138 lb 8 oz (62.8 kg)  12/28/22 145 lb (65.8 kg)  03/14/22 145 lb 6.4 oz (66 kg)    Physical Exam         [1]  Current Outpatient Medications on File Prior to Visit  Medication Sig Dispense Refill   albuterol  (VENTOLIN  HFA) 108 (90 Base) MCG/ACT inhaler Inhale 2 puffs into the lungs every 6 (six) hours as needed for wheezing or shortness of breath. 18 g 3   amoxicillin -clavulanate (AUGMENTIN ) 875-125 MG tablet Take 1 tablet by mouth 2 (two) times daily. (Patient not taking: Reported on 06/14/2023) 20 tablet 0   APPLE CIDER VINEGAR PO Take 2 capsules by mouth in the morning and at bedtime.     brompheniramine-pseudoephedrine-DM 30-2-10 MG/5ML syrup Take 5-10 mLs by mouth every 6 (six) hours as needed. 120 mL 0    ibuprofen (ADVIL) 200 MG tablet Take 200 mg by mouth daily as needed for headache or moderate pain.  (Patient not taking: Reported on 12/28/2022)     Multiple Vitamins-Minerals (MULTIVITAMIN WITH MINERALS) tablet Take 1 tablet by mouth daily. Takes two gummies every morning.     valACYclovir  (VALTREX ) 1000 MG tablet Take 1 tablet (1,000 mg total) by mouth daily. 90 tablet 3   No current facility-administered medications on file prior to visit.   "

## 2024-01-16 NOTE — Assessment & Plan Note (Signed)
 Chronic, symptomatically stable.  Continue Valtrex  1000 mg daily for history of herpes labialis suppressive dose

## 2024-01-16 NOTE — Assessment & Plan Note (Addendum)
 Discussed anxiety and depression and advocated for consideration of SSRI such as Zoloft or Prozac.  Patient politely declines daily medication.  At this time she feels that she has coping mechanisms such as exercise which help manage anxiety.  She would like to have a medication to take as needed.  In the setting of allergies, advised to trial Atarax  10 mg twice daily as needed.  She will also start Xyzal 5 mg as advised by her allergist.  Counseled on monitoring for sedation with two antihistamines. Plan not to increase dose of atarax  while on Xyzal due to risk of CNS depression.   She would however like to take Xanax  ahead of Pap smear.  I have provided Xanax  0.5 mg, 2 tablets, 1 to take prior to Pap smear and the other to take if she chooses to have mammogram.  Of note, She is considering whether or not she will have her mammogram and will let me know.

## 2024-01-17 ENCOUNTER — Telehealth: Payer: Self-pay | Admitting: Family

## 2024-01-18 NOTE — Telephone Encounter (Signed)
 close

## 2024-01-22 ENCOUNTER — Other Ambulatory Visit: Payer: Self-pay | Admitting: Family

## 2024-01-22 DIAGNOSIS — F32A Depression, unspecified: Secondary | ICD-10-CM

## 2024-02-08 ENCOUNTER — Emergency Department

## 2024-02-08 ENCOUNTER — Other Ambulatory Visit: Payer: Self-pay

## 2024-02-08 ENCOUNTER — Emergency Department
Admission: EM | Admit: 2024-02-08 | Discharge: 2024-02-08 | Disposition: A | Discharge status: Home / Self Care | Attending: Emergency Medicine | Admitting: Emergency Medicine

## 2024-02-08 DIAGNOSIS — Y92410 Unspecified street and highway as the place of occurrence of the external cause: Secondary | ICD-10-CM | POA: Insufficient documentation

## 2024-02-08 DIAGNOSIS — S63502A Unspecified sprain of left wrist, initial encounter: Secondary | ICD-10-CM | POA: Diagnosis not present

## 2024-02-08 DIAGNOSIS — S6992XA Unspecified injury of left wrist, hand and finger(s), initial encounter: Secondary | ICD-10-CM | POA: Diagnosis present
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
## 2024-02-26 ENCOUNTER — Encounter: Admitting: Family

## 2024-06-13 ENCOUNTER — Other Ambulatory Visit

## 2024-06-17 ENCOUNTER — Ambulatory Visit: Admitting: Oncology
